# Patient Record
Sex: Female | Born: 1935 | Race: White | Hispanic: No | Marital: Married | Smoking: Never smoker
Health system: Southern US, Community
[De-identification: ages and names within clinical notes are randomized; demographics above are authoritative.]

## PROBLEM LIST (undated history)

## (undated) DIAGNOSIS — R238 Other skin changes: Secondary | ICD-10-CM

## (undated) DIAGNOSIS — C801 Malignant (primary) neoplasm, unspecified: Secondary | ICD-10-CM

## (undated) DIAGNOSIS — R233 Spontaneous ecchymoses: Secondary | ICD-10-CM

## (undated) DIAGNOSIS — I499 Cardiac arrhythmia, unspecified: Secondary | ICD-10-CM

## (undated) DIAGNOSIS — I10 Essential (primary) hypertension: Secondary | ICD-10-CM

## (undated) DIAGNOSIS — I059 Rheumatic mitral valve disease, unspecified: Secondary | ICD-10-CM

## (undated) DIAGNOSIS — S7291XA Unspecified fracture of right femur, initial encounter for closed fracture: Secondary | ICD-10-CM

## (undated) HISTORY — PX: TONSILLECTOMY: SUR1361

## (undated) HISTORY — PX: CHOLECYSTECTOMY: SHX55

## (undated) HISTORY — PX: COLONOSCOPY: SHX174

---

## 2008-05-07 HISTORY — PX: CARDIAC VALVE SURGERY: SHX40

## 2011-04-18 ENCOUNTER — Other Ambulatory Visit: Payer: Self-pay | Admitting: Radiology

## 2011-04-18 DIAGNOSIS — C50911 Malignant neoplasm of unspecified site of right female breast: Secondary | ICD-10-CM

## 2011-04-19 ENCOUNTER — Telehealth: Payer: Self-pay | Admitting: *Deleted

## 2011-04-19 ENCOUNTER — Other Ambulatory Visit: Payer: Self-pay | Admitting: *Deleted

## 2011-04-19 DIAGNOSIS — C50319 Malignant neoplasm of lower-inner quadrant of unspecified female breast: Secondary | ICD-10-CM

## 2011-04-19 NOTE — Telephone Encounter (Signed)
Confirmed [facility] for 04/25/11 at 0815 .  Instructions and contact information given.  

## 2011-04-19 NOTE — Telephone Encounter (Signed)
Opened in error

## 2011-04-23 ENCOUNTER — Other Ambulatory Visit: Payer: Self-pay

## 2011-04-24 ENCOUNTER — Ambulatory Visit
Admission: RE | Admit: 2011-04-24 | Discharge: 2011-04-24 | Disposition: A | Discharge status: Home / Self Care | Payer: Medicare Other | Source: Ambulatory Visit | Attending: Radiology | Admitting: Radiology

## 2011-04-24 DIAGNOSIS — C50911 Malignant neoplasm of unspecified site of right female breast: Secondary | ICD-10-CM

## 2011-04-24 MED ORDER — GADOBENATE DIMEGLUMINE 529 MG/ML IV SOLN
5.0000 mL | Freq: Once | INTRAVENOUS | Status: AC | PRN
  Administered 2011-04-24: 5 mL via INTRAVENOUS

## 2011-04-25 ENCOUNTER — Encounter: Payer: Self-pay | Admitting: *Deleted

## 2011-04-25 ENCOUNTER — Ambulatory Visit (HOSPITAL_BASED_OUTPATIENT_CLINIC_OR_DEPARTMENT_OTHER): Payer: Medicare Other | Admitting: General Surgery

## 2011-04-25 ENCOUNTER — Encounter (INDEPENDENT_AMBULATORY_CARE_PROVIDER_SITE_OTHER): Payer: Self-pay | Admitting: General Surgery

## 2011-04-25 ENCOUNTER — Telehealth: Payer: Self-pay | Admitting: *Deleted

## 2011-04-25 ENCOUNTER — Ambulatory Visit: Payer: Medicare Other

## 2011-04-25 ENCOUNTER — Ambulatory Visit (HOSPITAL_BASED_OUTPATIENT_CLINIC_OR_DEPARTMENT_OTHER): Payer: BC Managed Care – PPO | Admitting: Oncology

## 2011-04-25 ENCOUNTER — Other Ambulatory Visit (INDEPENDENT_AMBULATORY_CARE_PROVIDER_SITE_OTHER): Payer: Self-pay | Admitting: General Surgery

## 2011-04-25 ENCOUNTER — Ambulatory Visit
Admission: RE | Admit: 2011-04-25 | Discharge: 2011-04-25 | Disposition: A | Discharge status: Home / Self Care | Payer: Medicare Other | Source: Ambulatory Visit | Attending: Radiation Oncology | Admitting: Radiation Oncology

## 2011-04-25 ENCOUNTER — Ambulatory Visit: Payer: Medicare Other | Attending: General Surgery | Admitting: Physical Therapy

## 2011-04-25 ENCOUNTER — Other Ambulatory Visit (HOSPITAL_BASED_OUTPATIENT_CLINIC_OR_DEPARTMENT_OTHER): Payer: Medicare Other | Admitting: Lab

## 2011-04-25 VITALS — BP 163/77 | HR 66 | Temp 97.6°F | Ht 65.5 in | Wt 119.1 lb

## 2011-04-25 DIAGNOSIS — C50919 Malignant neoplasm of unspecified site of unspecified female breast: Secondary | ICD-10-CM

## 2011-04-25 DIAGNOSIS — M25619 Stiffness of unspecified shoulder, not elsewhere classified: Secondary | ICD-10-CM | POA: Insufficient documentation

## 2011-04-25 DIAGNOSIS — C50319 Malignant neoplasm of lower-inner quadrant of unspecified female breast: Secondary | ICD-10-CM

## 2011-04-25 DIAGNOSIS — R293 Abnormal posture: Secondary | ICD-10-CM | POA: Insufficient documentation

## 2011-04-25 DIAGNOSIS — IMO0001 Reserved for inherently not codable concepts without codable children: Secondary | ICD-10-CM | POA: Insufficient documentation

## 2011-04-25 DIAGNOSIS — M40299 Other kyphosis, site unspecified: Secondary | ICD-10-CM | POA: Insufficient documentation

## 2011-04-25 LAB — CANCER ANTIGEN 27.29: CA 27.29: 38 U/mL (ref 0–39)

## 2011-04-25 LAB — CBC WITH DIFFERENTIAL/PLATELET
BASO%: 0.2 % (ref 0.0–2.0)
EOS%: 0.5 % (ref 0.0–7.0)
MCH: 32.8 pg (ref 25.1–34.0)
MCV: 98.8 fL (ref 79.5–101.0)
MONO%: 6.3 % (ref 0.0–14.0)
NEUT#: 5.4 10*3/uL (ref 1.5–6.5)
RBC: 4.25 10*6/uL (ref 3.70–5.45)
RDW: 14.5 % (ref 11.2–14.5)

## 2011-04-25 LAB — COMPREHENSIVE METABOLIC PANEL
ALT: 20 U/L (ref 0–35)
AST: 23 U/L (ref 0–37)
Albumin: 3.9 g/dL (ref 3.5–5.2)
Alkaline Phosphatase: 121 U/L — ABNORMAL HIGH (ref 39–117)
Potassium: 3.8 mEq/L (ref 3.5–5.3)
Sodium: 138 mEq/L (ref 135–145)
Total Protein: 7.1 g/dL (ref 6.0–8.3)

## 2011-04-25 NOTE — Progress Notes (Signed)
LAQUITTA Hamilton is an 77 y.o. female.   Chief Complaint: right breast cancer HPI: this patient is seen in the multiple disciplinary breast clinic for evaluation of a screening detected right breast cancer. She has had 2 prior cyst aspirations on that side but she states that these were benign in denies any other changes. She states that she does her self breast exam and denies any changes or new masses. She denies any prior mammographic abnormalities. She denies any systemic symptoms such as headaches, bony pains, or weight loss. She denies any significant family history of breast cancer. On her imaging studies she has a 6 mm nodule on ultrasound at the 5:00 position of the right breast and followup MRI demonstrated a 5 mm area of enhancement in the same area. This was either positive and PR positive and HER-2/neu negative. She has a history of mitral valve issues in atrial fibrillation but otherwise is fairly healthy.  No past medical history on file. Mitral valve surgery Atrial fibrillation  Past Surgical History  Procedure Date  . Tonsillectomy   . Cholecystectomy   . Cardiac valve surgery     No family history on file. Social History:  reports that she has never smoked. She does not have any smokeless tobacco history on file. She reports that she does not drink alcohol. Her drug history not on file.  Allergies:  Allergies  Allergen Reactions  . Sulfa Antibiotics     Childhood reaction unknown   All other review of systems negative or noncontributory except as stated in the HPI  No current outpatient prescriptions on file as of 04/25/2011.   Medications Prior to Admission  Medication Dose Route Frequency Provider Last Rate Last Dose  . gadobenate dimeglumine (MULTIHANCE) injection 5 mL  5 mL Intravenous Once PRN Medication Radiologist   5 mL at 04/24/11 1030    Results for orders placed in visit on 04/25/11 (from the past 48 hour(s))  CBC WITH DIFFERENTIAL     Status: Abnormal   Collection Time   04/25/11  8:31 AM      Component Value Range Comment   WBC 6.7  3.9 - 10.3 (10e3/uL)    NEUT# 5.4  1.5 - 6.5 (10e3/uL)    HGB 13.9  11.6 - 15.9 (g/dL)    HCT 16.1  09.6 - 04.5 (%)    Platelets 170  145 - 400 (10e3/uL)    MCV 98.8  79.5 - 101.0 (fL)    MCH 32.8  25.1 - 34.0 (pg)    MCHC 33.2  31.5 - 36.0 (g/dL)    RBC 4.09  8.11 - 9.14 (10e6/uL)    RDW 14.5  11.2 - 14.5 (%)    lymph# 0.9  0.9 - 3.3 (10e3/uL)    MONO# 0.4  0.1 - 0.9 (10e3/uL)    Eosinophils Absolute 0.0  0.0 - 0.5 (10e3/uL)    Basophils Absolute 0.0  0.0 - 0.1 (10e3/uL)    NEUT% 80.1 (*) 38.4 - 76.8 (%)    LYMPH% 12.9 (*) 14.0 - 49.7 (%)    MONO% 6.3  0.0 - 14.0 (%)    EOS% 0.5  0.0 - 7.0 (%)    BASO% 0.2  0.0 - 2.0 (%)   COMPREHENSIVE METABOLIC PANEL     Status: Abnormal   Collection Time   04/25/11  8:31 AM      Component Value Range Comment   Sodium 138  135 - 145 (mEq/L)    Potassium 3.8  3.5 -  5.3 (mEq/L)    Chloride 103  96 - 112 (mEq/L)    CO2 27  19 - 32 (mEq/L)    Glucose, Bld 81  70 - 99 (mg/dL)    BUN 22  6 - 23 (mg/dL)    Creatinine, Ser 4.09  0.50 - 1.10 (mg/dL)    Total Bilirubin 0.6  0.3 - 1.2 (mg/dL)    Alkaline Phosphatase 121 (*) 39 - 117 (U/L)    AST 23  0 - 37 (U/L)    ALT 20  0 - 35 (U/L)    Total Protein 7.1  6.0 - 8.3 (g/dL)    Albumin 3.9  3.5 - 5.2 (g/dL)    Calcium 9.3  8.4 - 10.5 (mg/dL)    Mr Breast Bilateral W Wo Contrast  04/24/2011  *RADIOLOGY REPORT*  Clinical Data: 77 year old female patient with recently diagnosed right breast  invasive mammary carcinoma.  Family history of breast cancer in an aunt at age 56.  The patient is extremely kyphotic and had difficulty remain motionless for the study. Decreased contrast dose was utilized (5 ml of Multihance).  BILATERAL BREAST MRI WITH AND WITHOUT CONTRAST  Technique: Multiplanar, multisequence MR images of both breasts were obtained prior to and following the intravenous administration of 5ml of Multihance.  Three  dimensional images were evaluated at the independent DynaCad workstation.  Comparison:  04/17/2011, 03/27/2011, and 03/22/2011 mammograms.  Findings: Considerable motion is present on the study due to the patient's kyphosis and age.  This limits the sensitivity of the examination.  The known invasive mammary carcinoma is visualized within the inferior portion of the right breast at the 6 o'clock position and measures 5 mm in size.  There is a clip artifact associated with the mass.  There are no additional worrisome enhancing foci within either breast.  There is no evidence for axillary or internal mammary adenopathy.  Generalized cardiomegaly is present.  IMPRESSION:  1.  5 mm enhancing mass with adjacent clip artifact within the inferior portion of the right breast at approximately the 6 o'clock position corresponding to the known recently diagnosed invasive mammary carcinoma. 2.  Limited study due to motion artifacts as discussed above.  THREE-DIMENSIONAL MR IMAGE RENDERING ON INDEPENDENT WORKSTATION:  Three-dimensional MR images were rendered by post-processing of the original MR data on an independent workstation.  The three- dimensional MR images were interpreted, and findings were reported in the accompanying complete MRI report for this study.  BI-RADS CATEGORY 6:  Known biopsy-proven malignancy - appropriate action should be taken.  Original Report Authenticated By: Rolla Plate, M.D.    @ROS @  Blood pressure 163/77, pulse 66, temperature 97.6 F (36.4 C), height 5' 5.5" (1.664 m), weight 119 lb 1.6 oz (54.023 kg). General appearance: alert, cooperative and no distress Head: Normocephalic, without obvious abnormality, atraumatic Eyes: conjunctivae/corneas clear. PERRL, EOM's intact. Fundi benign. Neck: no adenopathy, no carotid bruit and supple, symmetrical, trachea midline Resp: clear to auscultation bilaterally Chest wall: no tenderness Breasts: She hasn't bruising and postbiopsy changes  in the inferior portion of her right breast. She has a questionable nodule in the area  of the bruising and prior biopsy though I do not identify a definitive mass. Her axilla is negative for palpable disease she does have right lateral chest wall incision from her prior surgery. Her left breast is without skin changes, suspicious masses, or lymphadenopathy Cardio: regular rate and rhythm, S1, S2 normal, no murmur, click, rub or gallop GI: soft, non-tender; bowel sounds  normal; no masses,  no organomegaly Extremities: extremities normal, atraumatic, no cyanosis or edema Pulses: 2+ and symmetric Skin: Skin color, texture, turgor normal. No rashes or lesions Neurologic: Grossly normal  Assessment/Plan Right-sided breast cancer We have discussed this patient in our multidisciplinary breast conference and she would be a candidate for lumpectomy or mastectomy. She has a small tumor and certainly would be reasonable for breast conservation therapy. I have recommended needle localized lumpectomy on her right breast. I also explained that traditionally we would recommend sentinel lymph node biopsy and radiation as well although she is very minute with our oncologist and given her age, she is not likely going to receive any chemotherapy. I explained that since she is not going to receive any chemotherapy, there is no need to perform sentinel lymph node biopsy since this will not make any clinical difference for her. However, we will not know her true stage. She expressed understanding of the recommendations as well as the traditional recommendations for breast cancer therapy and she is in agreement with the plan for lumpectomy without sentinel lymph node biopsy and without chemotherapy. She will likely receive formal treatment and plus or minus radiation therapy. We will set her up for needle localized right breast lumpectomy as soon as available. Discussed with her the risks of recurrence, infection, bleeding,  need for future surgery and reoperation to get negative margins, and poor cosmesis and she expressed understanding and desires to proceed.   Lodema Pilot DAVID 04/25/2011, 12:29 PM

## 2011-04-25 NOTE — Progress Notes (Signed)
[facility] Radiation Oncology NEW PATIENT EVALUATION  Name: Alexis Hamilton MRN: [id number]  Date: 04/25/2011  DOB: [birth year]  Status: outpatient   CC:No primary provider on file.  Rulon Abide, DO    REFERRING PHYSICIAN: Rulon Abide, DO   DIAGNOSIS: Stage I (T1, N0, M0) invasive mammary carcinoma of the right breast    HISTORY OF PRESENT ILLNESS::Alexis Hamilton is a 77 y.o. female who is seen today for for evaluation of her T1 N0 invasive mammary carcinoma of the right breast. At the time of a screening mammogram in [place] on 03/22/2011 she is found to have a focal asymmetry within the right breast. Additional views and ultrasound on 04/02/2011 showed a spiculated mass with some microcalcifications located in 5 to 6:00 within the right breast, 3.5 cm from the nipple. This measures 0.6 cm on ultrasound at 5:00. Ultrasound-guided biopsy on 04/17/2011 was diagnostic for invasive mammary carcinoma which was strongly ER/PR positive with a low proliferation marker of 11%. She is without complaints today.  Marland Kitchen   PREVIOUS RADIATION THERAPY: No   PAST MEDICAL HISTORY:  has no past medical history on file.     PAST SURGICAL HISTORY: Past Surgical History  Procedure Date  . Tonsillectomy   . Cholecystectomy   . Cardiac valve surgery      FAMILY HISTORY: family history is not on file.   SOCIAL HISTORY:  reports that she has never smoked. She does not have any smokeless tobacco history on file. She reports that she does not drink alcohol.   ALLERGIES: Sulfa antibiotics   MEDICATIONS:  Current Outpatient Prescriptions  Medication Sig Dispense Refill  . amiodarone (PACERONE) 200 MG tablet Take 200 mg by mouth daily.        . calcium carbonate (OS-CAL) 600 MG TABS Take 600 mg by mouth 2 (two) times daily with a meal. With D       . furosemide (LASIX) 40 MG tablet Take 40 mg by mouth daily.        . Multiple Vitamin (MULTIVITAMIN) tablet Take 1 tablet  by mouth daily.        Marland Kitchen warfarin (COUMADIN) 1 MG tablet Take 1 mg by mouth as directed. 1mg  1-3 days 2mg  2-4 days           REVIEW OF SYSTEMS:  Pertinent items are noted in HPI.    PHYSICAL EXAM:  Alert and oriented 77 year-old white female appearing her stated age. Vital signs BP 163/77, pulse 66, temperature 97.6, RR 20 Head and neck examination grossly unremarkable. Nodes: Without palpable cervical, supraclavicular, or axillary lymphadenopathy. Chest: Lungs clear. Back: Without spinal or CVA discomfort. Heart: Regular rate and rhythm. Breast: There is a bruise at approximately 7:00 along the right breast. No masses are appreciated. Left breast without masses or lesions. Abdomen without hepatomegaly. Extremities without edema.   LABORATORY DATA:  Lab Results  Component Value Date   WBC 6.7 04/25/2011   HGB 13.9 04/25/2011   HCT 42.0 04/25/2011   MCV 98.8 04/25/2011   PLT 170 04/25/2011   Lab Results  Component Value Date   NA 138 04/25/2011   K 3.8 04/25/2011   CL 103 04/25/2011   CO2 27 04/25/2011   Lab Results  Component Value Date   ALT 20 04/25/2011   AST 23 04/25/2011   ALKPHOS 121* 04/25/2011   BILITOT 0.6 04/25/2011      IMPRESSION: Stage I (T1, N0, M0) invasive mammary carcinoma of the right  breast. I explained to the patient and her family that her local treatment options include mastectomy versus partial mastectomy plus or minus hormone therapy, plus or minus radiation therapy. While most patients would accept 5 years of adjuvant hormone therapy, and alternative would be 3 and one half weeks of right breast radiation should she not want to take hormone therapy for 5 years. I discussed the potential acute and late toxicities of radiation therapy. Recent therapy could be delivered in [place], and again she would receive a hypo-fractionated course. Dr. Darnelle Catalan will discuss with her adjuvant hormone therapy   PLAN: As above. The patient informs me that she may want to  avoid taking a pill for 5 years and, therefore she may be a candidate for radiation therapy is that of adjuvant hormone therapy. This can be discussed with Dr. Darnelle Catalan as he reviews the potential toxicities of adjuvant hormone therapy. I spent 40 minutes minutes face to face with the patient and more than 50% of that time was spent in counseling and/or coordination of care.

## 2011-04-25 NOTE — Telephone Encounter (Signed)
spoke with star made patient appointment for 06-12-2011 arrival time 12:00pm relied the message to dawn the nurse for the breast clinic

## 2011-04-25 NOTE — Progress Notes (Signed)
Alexis Hamilton  MR#: [id number]    History of present illness: The patient is an 77 year old [place], [place] woman who had routine screening mammography in [place] on 03/22/2011. The breasts were described as heterogeneously dense. In the right breast there was a focal asymmetry newly noted, and the patient was recalled for additional views 04/13/2011. An area of asymmetry with spiculated margins and associated microcalcifications was identified, measuring 6 mm by ultrasound, hypoechoic, with ill-defined margins. Ultrasound-guided core biopsy was performed 04/17/2011 and showed ([id number]) and invasive carcinoma with lobular features, grade 2, estrogen and progesterone 100% positive, with an MIB-1-of 11%, and no HER-2 amplification. MRI was obtained, showing a 5 mm solitary lesion (there was some motion artifact).  With this information the patient was presented at the multidisciplinary breast cancer conference 04/25/2011, and seen in the multidisciplinary breast cancer clinic the same day.  Past medical history:     No past medical history on file. History of osteopenia, cardiac arrhythmia Past surgical history:      Past Surgical History  Procedure Date  . Tonsillectomy   . Cholecystectomy   . Cardiac valve surgery    the patient underwent an annuloplasty procedure at [facility] in 2009.  Family history:    The patient's father died at the age of 12 from a presumed amount myocardial infarction. The patient's mother died at the age of 37 with Parkinson's disease. The patient was an only child. The patient's father had 5 sisters one of whom was diagnosed with breast cancer at an advanced age.  Gynecologic history:  GX P2, first pregnancy to term age 29. She had menarche age 26 and menopause in 73. She never used hormone replacement    Social history:   She is a retired Catering manager. Her husband Everlean Alstrom of work at [employer] as a Charity fundraiser. Daughter Lemont Fillers [address], teaches [employer] in  [place], [place]; daughter Ozzie Hoyle 47 teaches second grade in [place]. The patient has 1 grandchild. She attends a [employer]    ADVANCED DIRECTIVES: In place  Health maintenance:       History  Substance Use Topics  . Smoking status: Never Smoker   . Smokeless tobacco: Not on file  . Alcohol Use: No      Colonoscopy: 2002  PAP: -  Bone density: Osteopenia; most recent bone density more than 2 years ago  Cholesterol:   Review of systems:  She bruises easily. She sleeps with an extra pillow. Otherwise a detailed review of systems today was entirely unremarkable.  Allergies:     Allergies  Allergen Reactions  . Sulfa Antibiotics     Childhood reaction unknown    Medications:      Current Outpatient Prescriptions  Medication Sig Dispense Refill  . amiodarone (PACERONE) 200 MG tablet Take 200 mg by mouth daily.        . calcium carbonate (OS-CAL) 600 MG TABS Take 600 mg by mouth 2 (two) times daily with a meal. With D       . furosemide (LASIX) 40 MG tablet Take 40 mg by mouth daily.        . Multiple Vitamin (MULTIVITAMIN) tablet Take 1 tablet by mouth daily.        Marland Kitchen warfarin (COUMADIN) 1 MG tablet Take 1 mg by mouth as directed. 1mg  1-3 days 2mg  2-4 days         Physical exam:      Filed Vitals:   04/25/11 0853  BP: 163/77  Pulse:  66  Temp: 97.6 F (36.4 C)     Body mass index is 19.52 kg/(m^2).   ECOG performance status: 0  Oropharynx: Clear  Lungs: No rales or rhonchi  Adenopathy: No palpable adenopathy in including in particular the right axilla, which was benign to palpation  Heart regular rate and rhythm, no murmur appreciated  Abdomen soft nontender positive bowel sounds  Right breast: no masses palpated; no skin changes or nipple retraction noted other than minor ecchymosis from the recent biopsy.  Left breast: No suspicious findings  Musculoskeletal exam: Mild kyphosis moderate scoliosis, no focal spinal tenderness, no peripheral  edema  Neurologic exam: Nonfocal  Lab results:            Chemistry      Component Value Date/Time   NA 138 04/25/2011 0831   K 3.8 04/25/2011 0831   CL 103 04/25/2011 0831   CO2 27 04/25/2011 0831   BUN 22 04/25/2011 0831   CREATININE 0.94 04/25/2011 0831      Component Value Date/Time   CALCIUM 9.3 04/25/2011 0831   ALKPHOS 121* 04/25/2011 0831   AST 23 04/25/2011 0831   ALT 20 04/25/2011 0831   BILITOT 0.6 04/25/2011 0831         Lab Results  Component Value Date   WBC 6.7 04/25/2011   HGB 13.9 04/25/2011   HCT 42.0 04/25/2011   MCV 98.8 04/25/2011   PLT 170 04/25/2011   NEUTROABS 5.4 04/25/2011    Studies:      Mr Breast Bilateral W Wo Contrast  04/24/2011  *RADIOLOGY REPORT*  Clinical Data: 77 year old female patient with recently diagnosed right breast  invasive mammary carcinoma.  Family history of breast cancer in an aunt at age 54.  The patient is extremely kyphotic and had difficulty remain motionless for the study. Decreased contrast dose was utilized (5 ml of Multihance).  BILATERAL BREAST MRI WITH AND WITHOUT CONTRAST  Technique: Multiplanar, multisequence MR images of both breasts were obtained prior to and following the intravenous administration of 5ml of Multihance.  Three dimensional images were evaluated at the independent DynaCad workstation.  Comparison:  04/17/2011, 03/27/2011, and 03/22/2011 mammograms.  Findings: Considerable motion is present on the study due to the patient's kyphosis and age.  This limits the sensitivity of the examination.  The known invasive mammary carcinoma is visualized within the inferior portion of the right breast at the 6 o'clock position and measures 5 mm in size.  There is a clip artifact associated with the mass.  There are no additional worrisome enhancing foci within either breast.  There is no evidence for axillary or internal mammary adenopathy.  Generalized cardiomegaly is present.  IMPRESSION:  1.  5 mm enhancing  mass with adjacent clip artifact within the inferior portion of the right breast at approximately the 6 o'clock position corresponding to the known recently diagnosed invasive mammary carcinoma. 2.  Limited study due to motion artifacts as discussed above.  THREE-DIMENSIONAL MR IMAGE RENDERING ON INDEPENDENT WORKSTATION:  Three-dimensional MR images were rendered by post-processing of the original MR data on an independent workstation.  The three- dimensional MR images were interpreted, and findings were reported in the accompanying complete MRI report for this study.  BI-RADS CATEGORY 6:  Known biopsy-proven malignancy - appropriate action should be taken.  Original Report Authenticated By: Rolla Plate, M.D.     Assessment: 77 year old [place] woman status post right breast biopsy December of 2012 for an invasive breast cancer with lobular  features, clinically T1b N0, Stage I, strongly estrogen and progesterone receptor positive, with an MIB-1 of 11, and no HER-2 amplification.     Plan: We spent the better part of her hour-long visit today discussing the biology of her tumor, and the implications for treatment. She understands she has a very good prognosis she also understands that women over 66 have a choice after lumpectomy of receiving radiation alone, radiation plus anti-estrogens, or anti-estrogens alone.  She would not be a candidate for tamoxifen given her need for lifelong anticoagulation because of her arrhythmia and prior heart surgery. I think she would be a good candidate for aromatase inhibitors, except that she really has some kyphosis, and she tells me her cardiologist Dr. Earlene Plater has told her she should stay away from Reclast. I have written a letter to Dr. Earlene Plater to get more information regarding this, but if indeed it is not feasible to proceed to bisphosphonates, I would avoid aromatase inhibitors as well. In that case  she would simply have radiation and followup with  observation.  Because the patient lives in [place] she would prefer to be followed closer to home. We are setting her up to see our partner Dr. Ubaldo Glassing in [place], [place]. She will also have her radiation treatments there. Accordingly no further appointments have been made for her here.   Yaeko Fazekas C 04/25/2011

## 2011-04-26 ENCOUNTER — Encounter: Payer: Self-pay | Admitting: *Deleted

## 2011-04-26 NOTE — Progress Notes (Signed)
Clinical Social Work met with pt and pt's husband at [facility].  CSW informed pt of the patient and family support center, programs, and resources.  CSW also provided pt with contact information and a patient and family support program calendar.  Pt did not express any urgent needs, and was thankful for the support.  CSW encouraged pt to contact CSW with any needs and/or concerns.

## 2011-04-30 ENCOUNTER — Telehealth: Payer: Self-pay | Admitting: *Deleted

## 2011-04-30 NOTE — Telephone Encounter (Signed)
called left message to inform  Alexis Hamilton that the patient needs appointment with dr.murray

## 2011-05-02 ENCOUNTER — Encounter (HOSPITAL_COMMUNITY): Payer: Self-pay

## 2011-05-03 ENCOUNTER — Telehealth: Payer: Self-pay | Admitting: *Deleted

## 2011-05-03 ENCOUNTER — Encounter: Payer: Self-pay | Admitting: *Deleted

## 2011-05-03 NOTE — Telephone Encounter (Signed)
Spoke to pt concerning [facility] from 04/25/11.  Pt denies questions or concerns at this time.  Encourage pt to call with needs.  Contact information given.

## 2011-05-04 ENCOUNTER — Telehealth: Payer: Self-pay | Admitting: *Deleted

## 2011-05-04 NOTE — Telephone Encounter (Signed)
left message to inform the patient of  the new appointment in [facility] with Dr. Michell Heinrich on 05-07-2011 at 8:15am

## 2011-05-07 ENCOUNTER — Encounter: Payer: Self-pay | Admitting: *Deleted

## 2011-05-07 NOTE — Progress Notes (Signed)
Mailed after appt letter to pt. 

## 2011-05-14 NOTE — Pre-Procedure Instructions (Signed)
20 Alexis Hamilton  05/14/2011   Your procedure is scheduled on:  Thurs, Jan 10 @ 1:30 PM  Report to [facility] at 11:30  AM.  Call this number if you have problems the morning of surgery: [phone]   Remember:   Do not eat food:After Midnight.  May have clear liquids: up to 4 Hours before arrival.(until 7:30 am)  Clear liquids include soda, tea, black coffee, apple or grape juice, broth.  Take these medicines the morning of surgery with A SIP OF WATER: Amiodarone   Do not wear jewelry, make-up or nail polish.  Do not wear lotions, powders, or perfumes. You may wear deodorant.  Do not shave 48 hours prior to surgery.  Do not bring valuables to the hospital.  Contacts, dentures or bridgework may not be worn into surgery.  Leave suitcase in the car. After surgery it may be brought to your room.  For patients admitted to the hospital, checkout time is 11:00 AM the day of discharge.   Patients discharged the day of surgery will not be allowed to drive home.  Name and phone number of your driver:   Special Instructions: CHG Shower Use Special Wash: 1/2 bottle night before surgery and 1/2 bottle morning of surgery.   Please read over the following fact sheets that you were given: Pain Booklet, Coughing and Deep Breathing, MRSA Information and Surgical Site Infection Prevention

## 2011-05-15 ENCOUNTER — Encounter (HOSPITAL_COMMUNITY)
Admission: RE | Admit: 2011-05-15 | Discharge: 2011-05-15 | Disposition: A | Discharge status: Home / Self Care | Payer: Medicare Other | Source: Ambulatory Visit | Attending: General Surgery | Admitting: General Surgery

## 2011-05-15 ENCOUNTER — Encounter (HOSPITAL_COMMUNITY): Payer: Self-pay

## 2011-05-15 HISTORY — DX: Rheumatic mitral valve disease, unspecified: I05.9

## 2011-05-15 HISTORY — DX: Spontaneous ecchymoses: R23.3

## 2011-05-15 HISTORY — DX: Malignant (primary) neoplasm, unspecified: C80.1

## 2011-05-15 HISTORY — DX: Other skin changes: R23.8

## 2011-05-15 HISTORY — DX: Cardiac arrhythmia, unspecified: I49.9

## 2011-05-15 LAB — BASIC METABOLIC PANEL
BUN: 27 mg/dL — ABNORMAL HIGH (ref 6–23)
Calcium: 9.3 mg/dL (ref 8.4–10.5)
Chloride: 108 mEq/L (ref 96–112)
Creatinine, Ser: 1.07 mg/dL (ref 0.50–1.10)
GFR calc Af Amer: 55 mL/min — ABNORMAL LOW (ref >90)
GFR calc non Af Amer: 48 mL/min — ABNORMAL LOW (ref >90)

## 2011-05-15 LAB — CBC
HCT: 42.7 % (ref 36.0–46.0)
MCHC: 32.1 g/dL (ref 30.0–36.0)
Platelets: 204 10*3/uL (ref 150–400)
RDW: 14.6 % (ref 11.5–15.5)

## 2011-05-15 LAB — PROTIME-INR: INR: 1.24 (ref 0.00–1.49)

## 2011-05-15 NOTE — Progress Notes (Signed)
Dr.Steven Earlene Plater in [place] is cardiologist;last visit 2 months ago  Never had a stress test Heart cath unsure of when it was Echo done but unsure of when EkG/CXR  All of the above info to be requested

## 2011-05-15 NOTE — Consult Note (Signed)
Anesthesia:  Patient is an 78 year old female scheduled for right breast partial mastectomy on 05/17/11.  Her history included hypothyroidism, MVP s/p MV Repair, and breast CA.  Her Cardiologist is Dr. Lamont Snowball in [place], [place].  She was seen in July of 2012.  Dr. Earlene Plater also gave Coumadin recommendations for this procedure.  She has chronic afib at a rate of 57 bpm, LAD, LAFB, and LVH with repolarization abnormality by her 11/30/10 EKG done at his office.  An echo done on 11/30/10 showed normal LV function, EF 55%, markedly enlarged LA, mitral prosthetic ring with appropriate function and no MR.  Her last cath in 2007 showed essentially normal epicardial coronary arteries with low normal LV systolic function with anterior apical hypokinesis, and MVP.  (To my understanding, she underwent MV Repair in 2009.)  Labs noted.  Her BUN was elevated at 27, but Cr was normal at 1.07.  Her PTT was WNL.  Her PT was mildly elevated, but her INR was normal at 1.24.   She is for a CXR on the day of surgery.  If no worrisome findings, then plan to proceed.

## 2011-05-16 MED ORDER — CEFAZOLIN SODIUM 1-5 GM-% IV SOLN
1.0000 g | INTRAVENOUS | Status: AC
  Administered 2011-05-17: 1 g via INTRAVENOUS
  Filled 2011-05-16: qty 50

## 2011-05-17 ENCOUNTER — Ambulatory Visit (HOSPITAL_COMMUNITY): Payer: Medicare Other | Admitting: Vascular Surgery

## 2011-05-17 ENCOUNTER — Encounter (HOSPITAL_COMMUNITY): Payer: Self-pay | Admitting: *Deleted

## 2011-05-17 ENCOUNTER — Encounter (HOSPITAL_COMMUNITY)
Admission: RE | Disposition: A | Discharge status: Home / Self Care | Payer: Self-pay | Source: Ambulatory Visit | Attending: General Surgery

## 2011-05-17 ENCOUNTER — Encounter (HOSPITAL_COMMUNITY): Payer: Self-pay | Admitting: Vascular Surgery

## 2011-05-17 ENCOUNTER — Ambulatory Visit (HOSPITAL_COMMUNITY): Payer: Medicare Other

## 2011-05-17 ENCOUNTER — Other Ambulatory Visit (INDEPENDENT_AMBULATORY_CARE_PROVIDER_SITE_OTHER): Payer: Self-pay | Admitting: General Surgery

## 2011-05-17 ENCOUNTER — Ambulatory Visit (HOSPITAL_COMMUNITY)
Admission: RE | Admit: 2011-05-17 | Discharge: 2011-05-17 | Disposition: A | Discharge status: Home / Self Care | Payer: Medicare Other | Source: Ambulatory Visit | Attending: General Surgery | Admitting: General Surgery

## 2011-05-17 DIAGNOSIS — C50919 Malignant neoplasm of unspecified site of unspecified female breast: Secondary | ICD-10-CM | POA: Insufficient documentation

## 2011-05-17 DIAGNOSIS — Z01812 Encounter for preprocedural laboratory examination: Secondary | ICD-10-CM | POA: Insufficient documentation

## 2011-05-17 DIAGNOSIS — C50319 Malignant neoplasm of lower-inner quadrant of unspecified female breast: Secondary | ICD-10-CM

## 2011-05-17 HISTORY — PX: BREAST BIOPSY: SHX20

## 2011-05-17 SURGERY — BREAST BIOPSY WITH NEEDLE LOCALIZATION
Anesthesia: General | Site: Breast | Laterality: Right | Wound class: Clean

## 2011-05-17 MED ORDER — LIDOCAINE-EPINEPHRINE (PF) 1 %-1:200000 IJ SOLN
INTRAMUSCULAR | Status: DC | PRN
  Administered 2011-05-17: 16:00:00

## 2011-05-17 MED ORDER — PROMETHAZINE HCL 25 MG/ML IJ SOLN: 6.2500 mg | INTRAMUSCULAR | Status: DC | PRN

## 2011-05-17 MED ORDER — GLYCOPYRROLATE 0.2 MG/ML IJ SOLN
INTRAMUSCULAR | Status: DC | PRN
  Administered 2011-05-17 (×2): 0.2 mg via INTRAVENOUS

## 2011-05-17 MED ORDER — HYDROMORPHONE HCL PF 1 MG/ML IJ SOLN: 0.2500 mg | INTRAMUSCULAR | Status: DC | PRN

## 2011-05-17 MED ORDER — 0.9 % SODIUM CHLORIDE (POUR BTL) OPTIME
TOPICAL | Status: DC | PRN
  Administered 2011-05-17: 1000 mL

## 2011-05-17 MED ORDER — PROPOFOL 10 MG/ML IV EMUL
INTRAVENOUS | Status: DC | PRN
  Administered 2011-05-17: 180 mg via INTRAVENOUS

## 2011-05-17 MED ORDER — LACTATED RINGERS IV SOLN
INTRAVENOUS | Status: DC
  Administered 2011-05-17: 14:00:00 via INTRAVENOUS

## 2011-05-17 MED ORDER — LACTATED RINGERS IV SOLN: INTRAVENOUS | Status: DC

## 2011-05-17 MED ORDER — MIDAZOLAM HCL 5 MG/5ML IJ SOLN
INTRAMUSCULAR | Status: DC | PRN
  Administered 2011-05-17: 1 mg via INTRAVENOUS

## 2011-05-17 MED ORDER — ONDANSETRON HCL 4 MG/2ML IJ SOLN
INTRAMUSCULAR | Status: DC | PRN
  Administered 2011-05-17: 4 mg via INTRAVENOUS

## 2011-05-17 MED ORDER — MORPHINE SULFATE 2 MG/ML IJ SOLN: 0.0500 mg/kg | INTRAMUSCULAR | Status: DC | PRN

## 2011-05-17 MED ORDER — MEPERIDINE HCL 25 MG/ML IJ SOLN: 6.2500 mg | INTRAMUSCULAR | Status: DC | PRN

## 2011-05-17 MED ORDER — LIDOCAINE HCL (CARDIAC) 20 MG/ML IV SOLN
INTRAVENOUS | Status: DC | PRN
  Administered 2011-05-17: 50 mg via INTRAVENOUS

## 2011-05-17 MED ORDER — HYDROCODONE-ACETAMINOPHEN 5-500 MG PO TABS: 1.0000 | ORAL_TABLET | Freq: Four times a day (QID) | ORAL | Status: AC | PRN

## 2011-05-17 MED ORDER — FENTANYL CITRATE 0.05 MG/ML IJ SOLN
INTRAMUSCULAR | Status: DC | PRN
  Administered 2011-05-17 (×2): 25 ug via INTRAVENOUS
  Administered 2011-05-17: 50 ug via INTRAVENOUS
  Administered 2011-05-17: 25 ug via INTRAVENOUS

## 2011-05-17 MED ORDER — LACTATED RINGERS IV SOLN
INTRAVENOUS | Status: DC | PRN
  Administered 2011-05-17: 15:00:00 via INTRAVENOUS

## 2011-05-17 SURGICAL SUPPLY — 46 items
APPLIER CLIP 9.375 MED OPEN (MISCELLANEOUS)
BINDER BREAST LRG (GAUZE/BANDAGES/DRESSINGS) IMPLANT
BINDER BREAST MEDIUM (GAUZE/BANDAGES/DRESSINGS) IMPLANT
BINDER BREAST XLRG (GAUZE/BANDAGES/DRESSINGS) IMPLANT
CANISTER SUCTION 2500CC (MISCELLANEOUS) ×2 IMPLANT
CHLORAPREP W/TINT 26ML (MISCELLANEOUS) ×2 IMPLANT
CLIP APPLIE 9.375 MED OPEN (MISCELLANEOUS) IMPLANT
CLIP TI WIDE RED SMALL 6 (CLIP) ×2 IMPLANT
CLOTH BEACON ORANGE TIMEOUT ST (SAFETY) ×2 IMPLANT
CONT SPEC STER OR (MISCELLANEOUS) ×2 IMPLANT
COVER SURGICAL LIGHT HANDLE (MISCELLANEOUS) ×2 IMPLANT
DECANTER SPIKE VIAL GLASS SM (MISCELLANEOUS) IMPLANT
DERMABOND ADVANCED (GAUZE/BANDAGES/DRESSINGS) ×1
DERMABOND ADVANCED .7 DNX12 (GAUZE/BANDAGES/DRESSINGS) ×1 IMPLANT
DEVICE DUBIN SPECIMEN MAMMOGRA (MISCELLANEOUS) ×2 IMPLANT
DRAPE CHEST BREAST 15X10 FENES (DRAPES) ×2 IMPLANT
ELECT CAUTERY BLADE 6.4 (BLADE) ×2 IMPLANT
ELECT COATED BLADE 2.86 ST (ELECTRODE) ×2 IMPLANT
ELECT REM PT RETURN 9FT ADLT (ELECTROSURGICAL) ×2
ELECTRODE REM PT RTRN 9FT ADLT (ELECTROSURGICAL) ×1 IMPLANT
GLOVE BIO SURGEON STRL SZ7.5 (GLOVE) ×4 IMPLANT
GLOVE BIOGEL PI IND STRL 6.5 (GLOVE) ×2 IMPLANT
GLOVE BIOGEL PI IND STRL 7.5 (GLOVE) ×2 IMPLANT
GLOVE BIOGEL PI INDICATOR 6.5 (GLOVE) ×2
GLOVE BIOGEL PI INDICATOR 7.5 (GLOVE) ×2
GLOVE SS BIOGEL STRL SZ 6.5 (GLOVE) ×1 IMPLANT
GLOVE SUPERSENSE BIOGEL SZ 6.5 (GLOVE) ×1
GLOVE SURG SS PI 7.5 STRL IVOR (GLOVE) ×4 IMPLANT
GOWN PREVENTION PLUS XLARGE (GOWN DISPOSABLE) ×4 IMPLANT
GOWN STRL NON-REIN LRG LVL3 (GOWN DISPOSABLE) ×4 IMPLANT
KIT BASIN OR (CUSTOM PROCEDURE TRAY) ×2 IMPLANT
KIT MARKER MARGIN INK (KITS) IMPLANT
KIT ROOM TURNOVER OR (KITS) ×2 IMPLANT
NEEDLE HYPO 25GX1X1/2 BEV (NEEDLE) ×2 IMPLANT
NS IRRIG 1000ML POUR BTL (IV SOLUTION) ×2 IMPLANT
PACK GENERAL/GYN (CUSTOM PROCEDURE TRAY) ×2 IMPLANT
PAD ARMBOARD 7.5X6 YLW CONV (MISCELLANEOUS) ×4 IMPLANT
STAPLER VISISTAT 35W (STAPLE) IMPLANT
SUT MNCRL AB 4-0 PS2 18 (SUTURE) ×2 IMPLANT
SUT SILK 2 0 SH (SUTURE) ×2 IMPLANT
SUT VIC AB 3-0 SH 18 (SUTURE) ×2 IMPLANT
SUT VIC AB 3-0 SH 8-18 (SUTURE) ×2 IMPLANT
SYR CONTROL 10ML LL (SYRINGE) ×2 IMPLANT
TOWEL OR 17X24 6PK STRL BLUE (TOWEL DISPOSABLE) IMPLANT
TOWEL OR 17X26 10 PK STRL BLUE (TOWEL DISPOSABLE) ×2 IMPLANT
TOWEL OR NON WOVEN STRL DISP B (DISPOSABLE) ×2 IMPLANT

## 2011-05-17 NOTE — Op Note (Signed)
NAME:  Alexis Hamilton, Alexis Hamilton NO.:  [id number]  MEDICAL RECORD NO.:  [id number]  LOCATION:  [facility]                         FACILITY:  [facility]  PHYSICIAN:  Lodema Pilot, MD       DATE OF BIRTH:  05-Feb-1929  DATE OF PROCEDURE:  05/17/2011 DATE OF DISCHARGE:  05/17/2011                              OPERATIVE REPORT   PROCEDURE:  Needle localized right partial mastectomy.  PREOPERATIVE DIAGNOSIS:  Right breast cancer.  POSTOPERATIVE DIAGNOSIS:  Right breast cancer.  SURGEON:  Lodema Pilot, MD  ASSISTANT:  None.  ANESTHESIA:  General LMA anesthesia with 35 mL of 1% lidocaine with epinephrine and 0.25% of Marcaine in a 50:50 mixture.  FLUIDS:  600 mL of crystalloid.  ESTIMATED BLOOD LOSS:  Minimal.  DRAINS:  None.  SPECIMENS: 1. Right breast lumpectomy with a short stitch marking the superior     margin and long stitch marking the lateral margin. 2. Additional lateral margin with a silk stitch marking the new     lateral margin.  COMPLICATIONS:  None apparent.  FINDINGS:  Successful needle localized lumpectomy of the right breast. Confirmation of the retrieval of the clip was obtained by Dr. Yolanda Bonine and short stitch marks superior margin, long stitch marked the lateral margin.  Additional lateral margin was taken with the new silk stitch marked in new lateral margin.  Hemoclips were placed at the wound edges.  INDICATION FOR PROCEDURE:  Ms. Gunderman is an 78 year old female with a newly diagnosed right breast cancer needs surgical removal.  She had met with a radiation oncologist and the medical oncologist, and we did not perform sentinel lymph node biopsy because she was not going to be offered chemotherapy regardless of nodal status.  OPERATIVE DETAILS:  Ms. Schoenfelder was seen and evaluated in preoperative area and risks and benefits of procedure were again discussed in lay terms.  Informed consent was obtained.  I again discussed with her the fact that this  was not standard to commit sentinel lymph node biopsy. However, given the fact that she was not going to be offered chemotherapy regardless of her nodal status, sentinel lymph node biopsy would not be useful other than for staging purposes, and she expressed understanding.  She had already had a needle localization of her right breast mass and surgical site was marked.  Then, prophylactic antibiotics were given, and she was taken to the operating room, placed on table in supine position and general LMA anesthesia was obtained. Her right chest and breasts were prepped and draped in a standard surgical fashion, and procedure time-out was performed with all operative team members to confirm proper patient, procedure, then a circumareolar incision was made in the skin over the anticipated lesion and dissection carried down into the breast tissue using Bovie electrocautery.  1-cm thick breast flaps were created circumferentially and then, dissected down to the pectoral fascia.  The lesion was dissected circumferentially around the wire, and a short silk stitch was placed on the superior margin. A long silk stitch was placed on the lateral margin.  The lesion was undermined along the pectoralis fascia and then removed, and x-ray was performed, which demonstrated a clip  in the middle of the specimen, and Dr. Yolanda Bonine called and confirmed that the clip was indeed located within the specimen and in good position.  I then just judging by the placement of the needle within the specimen I felt that if there was going to be a close margin that this would be the lateral margin, so I took additional centimeter thick tissue of margin on the lateral aspect of the wound and down to the pectoralis fascia, and new silk stitch was placed on the lateral margin, and specimen was marked.  The silk stitch marks the new lateral margin on this additional lateral margin of tissue.  The wound was then irrigated with  sterile saline solution and hemostasis obtained with Bovie electrocautery.  If the inferior margin is positive, then there is not much tissue available to take posterior margin is the pectoralis fascia.  Medial margin is also limited on the tissue available for resection.  If margins are positive, we could probably take some more from the superior margin and the lateral margin if necessary.  The wound was hemostatic and hemoclips were placed at the margins of the wound at the base of the wound, and then the dermis was approximated with interrupted 3-0 Vicryl sutures, and prior to securing the final 2 sutures, angiocatheter was placed in the wound, and the wound was filled with 35 mL of 1% lidocaine with epinephrine and 0.25% Marcaine in a 50:50 mixture, and the final sutures were secured for a watertight closure.  Skin edges were approximated with 4-0 Monocryl subcuticular suture and Dermabond was applied.  All sponge, needle, and instrument counts were correct at the end of the case, and the patient tolerated the procedure well without apparent complication.  She was stable and ready for transfer to recovery room in stable condition.          ______________________________ Lodema Pilot, MD     BL/MEDQ  D:  05/17/2011  T:  05/17/2011  Job:  [id number]

## 2011-05-17 NOTE — Brief Op Note (Signed)
05/17/2011  4:05 PM  PATIENT:  Harlow Asa  78 y.o. female  PRE-OPERATIVE DIAGNOSIS:  Right breast cancer   POST-OPERATIVE DIAGNOSIS:  Right breast cancer   PROCEDURE:  Procedure(s): BREAST BIOPSY WITH NEEDLE LOCALIZATION  SURGEON:  Surgeon(s): Rulon Abide, DO  PHYSICIAN ASSISTANT:   ASSISTANTS: none   ANESTHESIA:   general  EBL:  Total I/O In: 600 [I.V.:600] Out: -   BLOOD ADMINISTERED:none  DRAINS: none   LOCAL MEDICATIONS USED:  MARCAINE 17CC and LIDOCAINE 17CC  SPECIMEN:  Source of Specimen:  right breast lumpectomy, additional lateral margin  DISPOSITION OF SPECIMEN:  PATHOLOGY  COUNTS:  YES  TOURNIQUET:  * No tourniquets in log *  DICTATION: .Other Dictation: Dictation Number [id number]  PLAN OF CARE: Discharge to home after PACU  PATIENT DISPOSITION:  PACU - hemodynamically stable.   Delay start of Pharmacological VTE agent (>24hrs) due to surgical blood loss or risk of bleeding:  {YES/NO/NOT APPLICABLE:20182

## 2011-05-17 NOTE — H&P (View-Only) (Signed)
Alexis Hamilton  MR#: [id number]    History of present illness: The patient is an 78-year-old [place], [place] woman who had routine screening mammography in [place] on 03/22/2011. The breasts were described as heterogeneously dense. In the right breast there was a focal asymmetry newly noted, and the patient was recalled for additional views 04/13/2011. An area of asymmetry with spiculated margins and associated microcalcifications was identified, measuring 6 mm by ultrasound, hypoechoic, with ill-defined margins. Ultrasound-guided core biopsy was performed 04/17/2011 and showed ([id number]) and invasive carcinoma with lobular features, grade 2, estrogen and progesterone 100% positive, with an MIB-1-of 11%, and no HER-2 amplification. MRI was obtained, showing a 5 mm solitary lesion (there was some motion artifact).  With this information the patient was presented at the multidisciplinary breast cancer conference 04/25/2011, and seen in the multidisciplinary breast cancer clinic the same day.  Past medical history:     No past medical history on file. History of osteopenia, cardiac arrhythmia Past surgical history:      Past Surgical History  Procedure Date  . Tonsillectomy   . Cholecystectomy   . Cardiac valve surgery    the patient underwent an annuloplasty procedure at [facility] in 2009.  Family history:    The patient's father died at the age of 68 from a presumed amount myocardial infarction. The patient's mother died at the age of 70 with Parkinson's disease. The patient was an only child. The patient's father had 5 sisters one of whom was diagnosed with breast cancer at an advanced age.  Gynecologic history:  GX P2, first pregnancy to term age 32. She had menarche age 14 and menopause in 1985. She never used hormone replacement    Social history:   She is a retired bookkeeper. Her husband Maurice of work at [employer] as a chemist. Daughter Elizabeth Pitts 49, teaches [employer] in  [place], [place]; daughter Cynthia Daniel 47 teaches second grade in [place]. The patient has 1 grandchild. She attends a [employer]    ADVANCED DIRECTIVES: In place  Health maintenance:       History  Substance Use Topics  . Smoking status: Never Smoker   . Smokeless tobacco: Not on file  . Alcohol Use: No      Colonoscopy: 2002  PAP: -  Bone density: Osteopenia; most recent bone density more than 2 years ago  Cholesterol:   Review of systems:  She bruises easily. She sleeps with an extra pillow. Otherwise a detailed review of systems today was entirely unremarkable.  Allergies:     Allergies  Allergen Reactions  . Sulfa Antibiotics     Childhood reaction unknown    Medications:      Current Outpatient Prescriptions  Medication Sig Dispense Refill  . amiodarone (PACERONE) 200 MG tablet Take 200 mg by mouth daily.        . calcium carbonate (OS-CAL) 600 MG TABS Take 600 mg by mouth 2 (two) times daily with a meal. With D       . furosemide (LASIX) 40 MG tablet Take 40 mg by mouth daily.        . Multiple Vitamin (MULTIVITAMIN) tablet Take 1 tablet by mouth daily.        . warfarin (COUMADIN) 1 MG tablet Take 1 mg by mouth as directed. 1mg 1-3 days 2mg 2-4 days         Physical exam:      Filed Vitals:   04/25/11 0853  BP: 163/77  Pulse:   66  Temp: 97.6 F (36.4 C)     Body mass index is 19.52 kg/(m^2).   ECOG performance status: 0  Oropharynx: Clear  Lungs: No rales or rhonchi  Adenopathy: No palpable adenopathy in including in particular the right axilla, which was benign to palpation  Heart regular rate and rhythm, no murmur appreciated  Abdomen soft nontender positive bowel sounds  Right breast: no masses palpated; no skin changes or nipple retraction noted other than minor ecchymosis from the recent biopsy.  Left breast: No suspicious findings  Musculoskeletal exam: Mild kyphosis moderate scoliosis, no focal spinal tenderness, no peripheral  edema  Neurologic exam: Nonfocal  Lab results:            Chemistry      Component Value Date/Time   NA 138 04/25/2011 0831   K 3.8 04/25/2011 0831   CL 103 04/25/2011 0831   CO2 27 04/25/2011 0831   BUN 22 04/25/2011 0831   CREATININE 0.94 04/25/2011 0831      Component Value Date/Time   CALCIUM 9.3 04/25/2011 0831   ALKPHOS 121* 04/25/2011 0831   AST 23 04/25/2011 0831   ALT 20 04/25/2011 0831   BILITOT 0.6 04/25/2011 0831         Lab Results  Component Value Date   WBC 6.7 04/25/2011   HGB 13.9 04/25/2011   HCT 42.0 04/25/2011   MCV 98.8 04/25/2011   PLT 170 04/25/2011   NEUTROABS 5.4 04/25/2011    Studies:      Mr Breast Bilateral W Wo Contrast  04/24/2011  *RADIOLOGY REPORT*  Clinical Data: 78-year-old female patient with recently diagnosed right breast  invasive mammary carcinoma.  Family history of breast cancer in an aunt at age 41.  The patient is extremely kyphotic and had difficulty remain motionless for the study. Decreased contrast dose was utilized (5 ml of Multihance).  BILATERAL BREAST MRI WITH AND WITHOUT CONTRAST  Technique: Multiplanar, multisequence MR images of both breasts were obtained prior to and following the intravenous administration of 5ml of Multihance.  Three dimensional images were evaluated at the independent DynaCad workstation.  Comparison:  04/17/2011, 03/27/2011, and 03/22/2011 mammograms.  Findings: Considerable motion is present on the study due to the patient's kyphosis and age.  This limits the sensitivity of the examination.  The known invasive mammary carcinoma is visualized within the inferior portion of the right breast at the 6 o'clock position and measures 5 mm in size.  There is a clip artifact associated with the mass.  There are no additional worrisome enhancing foci within either breast.  There is no evidence for axillary or internal mammary adenopathy.  Generalized cardiomegaly is present.  IMPRESSION:  1.  5 mm enhancing  mass with adjacent clip artifact within the inferior portion of the right breast at approximately the 6 o'clock position corresponding to the known recently diagnosed invasive mammary carcinoma. 2.  Limited study due to motion artifacts as discussed above.  THREE-DIMENSIONAL MR IMAGE RENDERING ON INDEPENDENT WORKSTATION:  Three-dimensional MR images were rendered by post-processing of the original MR data on an independent workstation.  The three- dimensional MR images were interpreted, and findings were reported in the accompanying complete MRI report for this study.  BI-RADS CATEGORY 6:  Known biopsy-proven malignancy - appropriate action should be taken.  Original Report Authenticated By: Pioneer JACKSON, M.D.     Assessment: 78-year-old [place] woman status post right breast biopsy December of 2012 for an invasive breast cancer with lobular   features, clinically T1b N0, Stage I, strongly estrogen and progesterone receptor positive, with an MIB-1 of 11, and no HER-2 amplification.     Plan: We spent the better part of her hour-long visit today discussing the biology of her tumor, and the implications for treatment. She understands she has a very good prognosis she also understands that women over 70 have a choice after lumpectomy of receiving radiation alone, radiation plus anti-estrogens, or anti-estrogens alone.  She would not be a candidate for tamoxifen given her need for lifelong anticoagulation because of her arrhythmia and prior heart surgery. I think she would be a good candidate for aromatase inhibitors, except that she really has some kyphosis, and she tells me her cardiologist Dr. Davis has told her she should stay away from Reclast. I have written a letter to Dr. Davis to get more information regarding this, but if indeed it is not feasible to proceed to bisphosphonates, I would avoid aromatase inhibitors as well. In that case  she would simply have radiation and followup with  observation.  Because the patient lives in [place] she would prefer to be followed closer to home. We are setting her up to see our partner Dr. Darovsky in [place], [place]. She will also have her radiation treatments there. Accordingly no further appointments have been made for her here.   MAGRINAT,GUSTAV C 04/25/2011      

## 2011-05-17 NOTE — Anesthesia Postprocedure Evaluation (Signed)
  Anesthesia Post-op Note  Patient: Alexis Hamilton  Procedure(s) Performed:  BREAST BIOPSY WITH NEEDLE LOCALIZATION - Right breast needle localized lumpectomy.  Patient Location: PACU  Anesthesia Type: General  Level of Consciousness: awake  Airway and Oxygen Therapy: Patient Spontanous Breathing  Post-op Pain: mild  Post-op Assessment: Post-op Vital signs reviewed  Post-op Vital Signs: stable  Complications: No apparent anesthesia complications

## 2011-05-17 NOTE — Transfer of Care (Addendum)
Immediate Anesthesia Transfer of Care Note  Patient: Alexis Hamilton  Procedure(s) Performed:  BREAST BIOPSY WITH NEEDLE LOCALIZATION - Right breast needle localized lumpectomy.  Patient Location: PACU  Anesthesia Type: General  Level of Consciousness: awake, alert  and oriented  Airway & Oxygen Therapy: Patient Spontanous Breathing and Patient connected to nasal cannula oxygen  Post-op Assessment: Report given to PACU RN and Post -op Vital signs reviewed and stable  Post vital signs: Reviewed and stable   Complications: No apparent anesthesia complications

## 2011-05-17 NOTE — Anesthesia Preprocedure Evaluation (Addendum)
Anesthesia Evaluation  Patient identified by MRN, date of birth, ID band Patient awake    Airway Mallampati: II      Dental   Pulmonary neg pulmonary ROS,          Cardiovascular + CAD + dysrhythmias Atrial Fibrillation Regular Normal History of Repair of Mitral valve with ring at [facility].   Neuro/Psych Negative Neurological ROS  Negative Psych ROS   GI/Hepatic negative GI ROS, Neg liver ROS,   Endo/Other  Hypothyroidism   Renal/GU      Musculoskeletal   Abdominal   Peds  Hematology negative hematology ROS (+)   Anesthesia Other Findings   Reproductive/Obstetrics                          Anesthesia Physical Anesthesia Plan  ASA: III  Anesthesia Plan: General   Post-op Pain Management:    Induction: Intravenous  Airway Management Planned: LMA  Additional Equipment:   Intra-op Plan:   Post-operative Plan:   Informed Consent:   Plan Discussed with: CRNA  Anesthesia Plan Comments:         Anesthesia Quick Evaluation

## 2011-05-17 NOTE — Preoperative (Signed)
Beta Blockers   Reason not to administer Beta Blockers:Not Applicable 

## 2011-05-17 NOTE — Progress Notes (Signed)
Pt arived a ssa after  Going to the  Breast center .... She is not scheduled in nuclear medicine.Marland KitchenMarland Kitchen

## 2011-05-17 NOTE — Interval H&P Note (Signed)
History and Physical Interval Note:  05/17/2011 2:14 PM  RUPA LAGAN  has presented today for surgery, with the diagnosis of Right breast cancer .  The various methods of treatment have been discussed with the patient and family. After consideration of risks, benefits and other options for treatment, the patient has consented to  Procedure(s): BREAST BIOPSY WITH NEEDLE LOCALIZATION as a surgical intervention .  The patients' history has been reviewed, patient examined, no change in status, stable for surgery.  I have reviewed the patients' chart and labs.  Questions were answered to the patient's satisfaction.  Films reviewed and we again reviewed the risks of the procedure including infection, bleeding, pain, scarring, recurrence, need for repeat surgery, and poor cosmesis and she expressed understanding. I again discussed with her that we normally proceed with sentinel lymph node biopsy for staging and to evaluate nodal status but her oncologist is not offering chemotherapy regardless of nodal status so we have decided not to do SLN bx as this will not affect her treatment in any way. She expressed understanding of this as well.   Lodema Pilot DAVID

## 2011-05-17 NOTE — Anesthesia Procedure Notes (Signed)
Procedure Name: LMA Insertion Date/Time: 05/17/2011 2:53 PM Performed by: Iona Hansen Pre-anesthesia Checklist: Patient identified, Timeout performed, Emergency Drugs available, Suction available and Patient being monitored Patient Re-evaluated:Patient Re-evaluated prior to inductionOxygen Delivery Method: Circle System Utilized Preoxygenation: Pre-oxygenation with 100% oxygen Intubation Type: IV induction Ventilation: Mask ventilation without difficulty LMA: LMA inserted LMA Size: 4.0 Number of attempts: 1 Placement Confirmation: positive ETCO2 and breath sounds checked- equal and bilateral Tube secured with: Tape Dental Injury: Teeth and Oropharynx as per pre-operative assessment

## 2011-05-18 ENCOUNTER — Encounter (HOSPITAL_COMMUNITY): Payer: Self-pay | Admitting: General Surgery

## 2011-05-31 ENCOUNTER — Encounter (INDEPENDENT_AMBULATORY_CARE_PROVIDER_SITE_OTHER): Payer: Self-pay | Admitting: General Surgery

## 2011-05-31 ENCOUNTER — Ambulatory Visit (INDEPENDENT_AMBULATORY_CARE_PROVIDER_SITE_OTHER): Payer: Medicare Other | Admitting: General Surgery

## 2011-05-31 VITALS — BP 144/78 | HR 70 | Temp 97.6°F | Resp 18 | Ht 66.5 in | Wt 118.0 lb

## 2011-05-31 DIAGNOSIS — Z5189 Encounter for other specified aftercare: Secondary | ICD-10-CM

## 2011-05-31 DIAGNOSIS — Z4889 Encounter for other specified surgical aftercare: Secondary | ICD-10-CM

## 2011-05-31 NOTE — Progress Notes (Signed)
Subjective:     Patient ID: Alexis Hamilton, female   DOB: Nov 13, 1928, 78 y.o.   MRN: [id number]  HPI This patient follows up in 2 weeks status post right needle localized lumpectomy for breast cancer. Her pathology was consistent with a low-grade subcentimeter tumor which was ER and PR positive. She had a close superficial margin at 1 mm but other margins were negative. She reports doing well and denies any discomfort. She is scheduled to follow up for radiation treatment next week.the  Review of Systems     Objective:   Physical Exam No distress and nontoxic-appearing  Her incision is healing well without sign of infection. Chest good cosmesis. Nontender.    Assessment:     Status post right needle localized lumpectomy for cancer.  She is doing very well and has good cosmesis. Her pathology was favorable overall. She has a T1 tumor which was low-grade and margins were negative although she had a close superficial margin at 0.1 cm. I explained that margins were negative although this superficial margin was close and generally I would recommend reexcision of the superficial margin although in this case the superficial margin will be close the skin all I think she probably has some tissue that we could harvest from the area.  I have discussed her case with Dr. Dayton Scrape and Dr. Michell Heinrich and after discussion, I have recommended that we perform reexcision of her right breast superficial margin.      Plan:     We will set her up for reexcision of right breast margins.

## 2011-06-01 ENCOUNTER — Encounter (INDEPENDENT_AMBULATORY_CARE_PROVIDER_SITE_OTHER): Payer: Medicare Other | Admitting: General Surgery

## 2011-06-06 ENCOUNTER — Encounter (HOSPITAL_BASED_OUTPATIENT_CLINIC_OR_DEPARTMENT_OTHER): Payer: Self-pay | Admitting: *Deleted

## 2011-06-06 NOTE — Progress Notes (Signed)
Pt had lumpectomy 1/13-did well-will need pt-ptt-istat dos-lives in [place]

## 2011-06-08 ENCOUNTER — Other Ambulatory Visit (INDEPENDENT_AMBULATORY_CARE_PROVIDER_SITE_OTHER): Payer: Self-pay | Admitting: General Surgery

## 2011-06-08 ENCOUNTER — Encounter (HOSPITAL_BASED_OUTPATIENT_CLINIC_OR_DEPARTMENT_OTHER)
Admission: RE | Disposition: A | Discharge status: Home / Self Care | Payer: Self-pay | Source: Ambulatory Visit | Attending: General Surgery

## 2011-06-08 ENCOUNTER — Ambulatory Visit (HOSPITAL_BASED_OUTPATIENT_CLINIC_OR_DEPARTMENT_OTHER)
Admission: RE | Admit: 2011-06-08 | Discharge: 2011-06-08 | Disposition: A | Discharge status: Home / Self Care | Payer: Medicare Other | Source: Ambulatory Visit | Attending: General Surgery | Admitting: General Surgery

## 2011-06-08 ENCOUNTER — Encounter (HOSPITAL_BASED_OUTPATIENT_CLINIC_OR_DEPARTMENT_OTHER): Payer: Self-pay | Admitting: *Deleted

## 2011-06-08 ENCOUNTER — Ambulatory Visit (HOSPITAL_BASED_OUTPATIENT_CLINIC_OR_DEPARTMENT_OTHER): Payer: Medicare Other | Admitting: Certified Registered"

## 2011-06-08 ENCOUNTER — Encounter (HOSPITAL_BASED_OUTPATIENT_CLINIC_OR_DEPARTMENT_OTHER): Payer: Self-pay | Admitting: Certified Registered"

## 2011-06-08 DIAGNOSIS — Z4889 Encounter for other specified surgical aftercare: Secondary | ICD-10-CM

## 2011-06-08 DIAGNOSIS — C50919 Malignant neoplasm of unspecified site of unspecified female breast: Secondary | ICD-10-CM | POA: Insufficient documentation

## 2011-06-08 DIAGNOSIS — I10 Essential (primary) hypertension: Secondary | ICD-10-CM | POA: Insufficient documentation

## 2011-06-08 HISTORY — DX: Essential (primary) hypertension: I10

## 2011-06-08 LAB — POCT I-STAT, CHEM 8
Calcium, Ion: 1.06 mmol/L — ABNORMAL LOW (ref 1.12–1.32)
Chloride: 110 mEq/L (ref 96–112)
HCT: 43 % (ref 36.0–46.0)
Potassium: 4.1 mEq/L (ref 3.5–5.1)
Sodium: 143 mEq/L (ref 135–145)

## 2011-06-08 LAB — PROTIME-INR
INR: 1.18 (ref 0.00–1.49)
Prothrombin Time: 15.3 seconds — ABNORMAL HIGH (ref 11.6–15.2)

## 2011-06-08 SURGERY — EXCISION, LESION, BREAST
Anesthesia: General | Site: Breast | Laterality: Right | Wound class: Clean

## 2011-06-08 MED ORDER — LACTATED RINGERS IV SOLN
INTRAVENOUS | Status: DC
  Administered 2011-06-08: 13:00:00 via INTRAVENOUS

## 2011-06-08 MED ORDER — BUPIVACAINE HCL (PF) 0.25 % IJ SOLN
INTRAMUSCULAR | Status: DC | PRN
  Administered 2011-06-08: 20 mL

## 2011-06-08 MED ORDER — ONDANSETRON HCL 4 MG/2ML IJ SOLN
INTRAMUSCULAR | Status: DC | PRN
  Administered 2011-06-08: 4 mg via INTRAVENOUS

## 2011-06-08 MED ORDER — CEFAZOLIN SODIUM 1-5 GM-% IV SOLN
1.0000 g | INTRAVENOUS | Status: AC
  Administered 2011-06-08: 1 g via INTRAVENOUS

## 2011-06-08 MED ORDER — EPHEDRINE SULFATE 50 MG/ML IJ SOLN
INTRAMUSCULAR | Status: DC | PRN
  Administered 2011-06-08: 10 mg via INTRAVENOUS

## 2011-06-08 MED ORDER — STERILE WATER FOR IRRIGATION IR SOLN
Status: DC | PRN
  Administered 2011-06-08 (×2): 1

## 2011-06-08 MED ORDER — LIDOCAINE-EPINEPHRINE (PF) 1 %-1:200000 IJ SOLN
INTRAMUSCULAR | Status: DC | PRN
  Administered 2011-06-08: 20 mL

## 2011-06-08 MED ORDER — FENTANYL CITRATE 0.05 MG/ML IJ SOLN: 25.0000 ug | INTRAMUSCULAR | Status: DC | PRN

## 2011-06-08 MED ORDER — PROMETHAZINE HCL 25 MG/ML IJ SOLN: 6.2500 mg | INTRAMUSCULAR | Status: DC | PRN

## 2011-06-08 MED ORDER — MIDAZOLAM HCL 5 MG/5ML IJ SOLN
INTRAMUSCULAR | Status: DC | PRN
  Administered 2011-06-08: 1 mg via INTRAVENOUS

## 2011-06-08 MED ORDER — GLYCOPYRROLATE 0.2 MG/ML IJ SOLN
INTRAMUSCULAR | Status: DC | PRN
  Administered 2011-06-08: 0.2 mg via INTRAVENOUS

## 2011-06-08 MED ORDER — PROPOFOL 10 MG/ML IV EMUL
INTRAVENOUS | Status: DC | PRN
  Administered 2011-06-08: 120 mg via INTRAVENOUS

## 2011-06-08 MED ORDER — FENTANYL CITRATE 0.05 MG/ML IJ SOLN
INTRAMUSCULAR | Status: DC | PRN
  Administered 2011-06-08: 25 ug via INTRAVENOUS
  Administered 2011-06-08: 50 ug via INTRAVENOUS

## 2011-06-08 SURGICAL SUPPLY — 44 items
BENZOIN TINCTURE PRP APPL 2/3 (GAUZE/BANDAGES/DRESSINGS) ×2 IMPLANT
BINDER BREAST LRG (GAUZE/BANDAGES/DRESSINGS) IMPLANT
BINDER BREAST MEDIUM (GAUZE/BANDAGES/DRESSINGS) IMPLANT
BINDER BREAST XLRG (GAUZE/BANDAGES/DRESSINGS) IMPLANT
BINDER BREAST XXLRG (GAUZE/BANDAGES/DRESSINGS) IMPLANT
BLADE SURG 15 STRL LF DISP TIS (BLADE) ×1 IMPLANT
BLADE SURG 15 STRL SS (BLADE) ×1
CANISTER SUCTION 1200CC (MISCELLANEOUS) ×2 IMPLANT
CHLORAPREP W/TINT 26ML (MISCELLANEOUS) ×2 IMPLANT
CLIP TI WIDE RED SMALL 6 (CLIP) ×2 IMPLANT
COVER MAYO STAND STRL (DRAPES) ×2 IMPLANT
COVER TABLE BACK 60X90 (DRAPES) ×2 IMPLANT
DECANTER SPIKE VIAL GLASS SM (MISCELLANEOUS) IMPLANT
DRAIN PENROSE 1/2X12 LTX STRL (WOUND CARE) IMPLANT
DRAPE PED LAPAROTOMY (DRAPES) ×2 IMPLANT
DRAPE UTILITY XL STRL (DRAPES) ×2 IMPLANT
ELECT COATED BLADE 2.86 ST (ELECTRODE) ×2 IMPLANT
ELECT REM PT RETURN 9FT ADLT (ELECTROSURGICAL) ×2
ELECTRODE REM PT RTRN 9FT ADLT (ELECTROSURGICAL) ×1 IMPLANT
GAUZE SPONGE 4X4 12PLY STRL LF (GAUZE/BANDAGES/DRESSINGS) ×2 IMPLANT
GLOVE BIO SURGEON STRL SZ7 (GLOVE) ×2 IMPLANT
GLOVE BIOGEL PI IND STRL 7.0 (GLOVE) ×1 IMPLANT
GLOVE BIOGEL PI INDICATOR 7.0 (GLOVE) ×1
GLOVE SURG SS PI 7.5 STRL IVOR (GLOVE) ×6 IMPLANT
GOWN PREVENTION PLUS XLARGE (GOWN DISPOSABLE) ×4 IMPLANT
GOWN PREVENTION PLUS XXLARGE (GOWN DISPOSABLE) IMPLANT
NEEDLE HYPO 22GX1.5 SAFETY (NEEDLE) ×2 IMPLANT
NS IRRIG 1000ML POUR BTL (IV SOLUTION) IMPLANT
PACK BASIN DAY SURGERY FS (CUSTOM PROCEDURE TRAY) ×2 IMPLANT
PENCIL BUTTON HOLSTER BLD 10FT (ELECTRODE) ×2 IMPLANT
SLEEVE SCD COMPRESS KNEE MED (MISCELLANEOUS) ×2 IMPLANT
SPONGE GAUZE 4X4 12PLY (GAUZE/BANDAGES/DRESSINGS) ×2 IMPLANT
SPONGE LAP 4X18 X RAY DECT (DISPOSABLE) ×2 IMPLANT
STRIP CLOSURE SKIN 1/2X4 (GAUZE/BANDAGES/DRESSINGS) ×2 IMPLANT
SUT MNCRL AB 4-0 PS2 18 (SUTURE) ×2 IMPLANT
SUT SILK 2 0 SH (SUTURE) IMPLANT
SUT VICRYL 3-0 CR8 SH (SUTURE) ×2 IMPLANT
SYR CONTROL 10ML LL (SYRINGE) ×2 IMPLANT
TAPE CLOTH SURG 4X10 WHT LF (GAUZE/BANDAGES/DRESSINGS) ×2 IMPLANT
TOWEL OR 17X24 6PK STRL BLUE (TOWEL DISPOSABLE) ×2 IMPLANT
TOWEL OR NON WOVEN STRL DISP B (DISPOSABLE) ×2 IMPLANT
TUBE CONNECTING 20X1/4 (TUBING) ×2 IMPLANT
WATER STERILE IRR 1000ML POUR (IV SOLUTION) ×2 IMPLANT
YANKAUER SUCT BULB TIP NO VENT (SUCTIONS) ×2 IMPLANT

## 2011-06-08 NOTE — Brief Op Note (Signed)
06/08/2011  1:44 PM  PATIENT:  Alexis Hamilton  78 y.o. female  PRE-OPERATIVE DIAGNOSIS:  Right breast cancer  POST-OPERATIVE DIAGNOSIS:  right breast cancer  PROCEDURE:  Procedure(s): RE-EXCISION OF BREAST LUMPECTOMY  SURGEON:  Surgeon(s): Rulon Abide, DO  PHYSICIAN ASSISTANT:   ASSISTANTS: none   ANESTHESIA:   general  EBL:     BLOOD ADMINISTERED:none  DRAINS: none   LOCAL MEDICATIONS USED:  MARCAINE 20CC and LIDOCAINE 20CC  SPECIMEN:  Source of Specimen:  additional superficial margin, suture on new superficial margin  DISPOSITION OF SPECIMEN:  PATHOLOGY  COUNTS:  YES  TOURNIQUET:  * No tourniquets in log *  DICTATION: .Other Dictation: Dictation Number [id number]  PLAN OF CARE: Discharge to home after PACU  PATIENT DISPOSITION:  PACU - hemodynamically stable.   Delay start of Pharmacological VTE agent (>24hrs) due to surgical blood loss or risk of bleeding:  {YES/NO/NOT APPLICABLE:20182

## 2011-06-08 NOTE — Transfer of Care (Signed)
Immediate Anesthesia Transfer of Care Note  Patient: Alexis Hamilton  Procedure(s) Performed:  RE-EXCISION OF BREAST LUMPECTOMY - reexcision of right breast margins  Patient Location: PACU  Anesthesia Type: General  Level of Consciousness: awake, oriented, sedated and patient cooperative  Airway & Oxygen Therapy: Patient Spontanous Breathing and Patient connected to face mask oxygen  Post-op Assessment: Report given to PACU RN and Post -op Vital signs reviewed and stable  Post vital signs: Reviewed and stable  Complications: No apparent anesthesia complications

## 2011-06-08 NOTE — Interval H&P Note (Signed)
History and Physical Interval Note:  06/08/2011 12:37 PM  Alexis Hamilton  has presented today for surgery, with the diagnosis of Right breast cancer  The various methods of treatment have been discussed with the patient and family. After consideration of risks, benefits and other options for treatment, the patient has consented to  Procedure(s): RE-EXCISION OF BREAST LUMPECTOMY as a surgical intervention .  The patients' history has been reviewed, patient examined, no change in status, stable for surgery.  I have reviewed the patients' chart and labs.  Questions were answered to the patient's satisfaction.  Site marked and risks of procedure again discussed including positive margins and need for repeat surgery, and loss of nipple and poor cosmesis and she expressed understanding and desires to proceed with reexcision of right breast margins.   Lodema Pilot DAVID

## 2011-06-08 NOTE — H&P (View-Only) (Signed)
Subjective:     Patient ID: Alexis Hamilton, female   DOB: 01/17/1929, 78 y.o.   MRN: [id number]  HPI This patient follows up in 2 weeks status post right needle localized lumpectomy for breast cancer. Her pathology was consistent with a low-grade subcentimeter tumor which was ER and PR positive. She had a close superficial margin at 1 mm but other margins were negative. She reports doing well and denies any discomfort. She is scheduled to follow up for radiation treatment next week.the  Review of Systems     Objective:   Physical Exam No distress and nontoxic-appearing  Her incision is healing well without sign of infection. Chest good cosmesis. Nontender.    Assessment:     Status post right needle localized lumpectomy for cancer.  She is doing very well and has good cosmesis. Her pathology was favorable overall. She has a T1 tumor which was low-grade and margins were negative although she had a close superficial margin at 0.1 cm. I explained that margins were negative although this superficial margin was close and generally I would recommend reexcision of the superficial margin although in this case the superficial margin will be close the skin all I think she probably has some tissue that we could harvest from the area.  I have discussed her case with Dr. Murray and Dr. Wentworth and after discussion, I have recommended that we perform reexcision of her right breast superficial margin.      Plan:     We will set her up for reexcision of right breast margins.      

## 2011-06-08 NOTE — Anesthesia Procedure Notes (Signed)
Procedure Name: LMA Insertion Date/Time: 06/08/2011 12:50 PM Performed by: Renella Cunas D Pre-anesthesia Checklist: Patient identified, Emergency Drugs available, Suction available and Patient being monitored Patient Re-evaluated:Patient Re-evaluated prior to inductionOxygen Delivery Method: Circle System Utilized Preoxygenation: Pre-oxygenation with 100% oxygen Intubation Type: IV induction Ventilation: Mask ventilation without difficulty LMA: LMA inserted LMA Size: 4.0 Number of attempts: 1 Airway Equipment and Method: bite block Placement Confirmation: positive ETCO2 Tube secured with: Tape Dental Injury: Teeth and Oropharynx as per pre-operative assessment

## 2011-06-08 NOTE — Anesthesia Preprocedure Evaluation (Addendum)
Anesthesia Evaluation  Patient identified by MRN, date of birth, ID band Patient awake    Reviewed: Allergy & Precautions, H&P , NPO status , Patient's Chart, lab work & pertinent test results  History of Anesthesia Complications Negative for: history of anesthetic complications  Airway Mallampati: II TM Distance: >3 FB Neck ROM: Full    Dental No notable dental hx. (+) Dental Advisory Given and Caps   Pulmonary neg pulmonary ROS,  clear to auscultation  Pulmonary exam normal       Cardiovascular hypertension, Pt. on medications + dysrhythmias (no coumadin in 6 days, has converted to SR, INR 1.18 today) Atrial Fibrillation Regular Normal    Neuro/Psych Negative Neurological ROS     GI/Hepatic negative GI ROS, Neg liver ROS,   Endo/Other  Negative Endocrine ROS  Renal/GU negative Renal ROS     Musculoskeletal   Abdominal   Peds  Hematology negative hematology ROS (+)   Anesthesia Other Findings   Reproductive/Obstetrics                          Anesthesia Physical Anesthesia Plan  ASA: III  Anesthesia Plan: General   Post-op Pain Management:    Induction: Intravenous  Airway Management Planned: LMA  Additional Equipment:   Intra-op Plan:   Post-operative Plan:   Informed Consent: I have reviewed the patients History and Physical, chart, labs and discussed the procedure including the risks, benefits and alternatives for the proposed anesthesia with the patient or authorized representative who has indicated his/her understanding and acceptance.   Dental advisory given  Plan Discussed with: CRNA and Surgeon  Anesthesia Plan Comments: (Plan routine monitors, GA- LMA OK  )        Anesthesia Quick Evaluation

## 2011-06-08 NOTE — Op Note (Signed)
NAME:  Alexis Hamilton, Alexis Hamilton NO.:  [id number]  MEDICAL RECORD NO.:  [id number]  LOCATION:                                 FACILITY:  PHYSICIAN:  Lodema Pilot, MD       DATE OF BIRTH:  1928/09/10  DATE OF PROCEDURE:  06/08/2011 DATE OF DISCHARGE:                              OPERATIVE REPORT   PROCEDURE:  Reexcision of right breast margins.  PREOPERATIVE DIAGNOSIS:  Right breast cancer.  POSTOPERATIVE DIAGNOSIS:  Right breast cancer.  SURGEON:  Lodema Pilot, MD  ASSISTANT:  None.  ANESTHESIA:  General LMA anesthesia with 40 mL of 1% lidocaine with epinephrine and 0.25% Marcaine in a 50:50 mixture.  FLUIDS:  900 mL of crystalloid.  ESTIMATED BLOOD LOSS:  Minimal.  DRAINS:  None.  SPECIMENS:  Additional superficial margin sent to Pathology for permanent section with the suture marking the new superficial margin.  COMPLICATIONS:  None apparent.  FINDINGS:  Additional tissue could be taken from the superior margin and medial margin, but no additional tissue can be taken from the deep margin, the inferior margin, or lateral margin or superficial margin. Clips were placed.  INDICATION FOR PROCEDURE:  Alexis Hamilton is an 78 year old female with a recently diagnosed right breast cancer who underwent excision of her prior breast cancer.  She had a close superficial margin, and we recommended reexcision for definitive treatment.  OPERATIVE DETAILS:  Alexis Hamilton was seen and evaluated in the preoperative area, and risks and benefits of the procedure were again discussed in lay terms.  Informed consent was obtained.  Surgical site was marked prior to anesthetic administration.  She was given prophylactic antibiotics and taken to the operating room and placed on table in supine position.  General LMA anesthesia was obtained.  Her right breast and chest were prepped and draped in a standard surgical fashion, and her prior lumpectomy incision was opened to the length  of the incision.  She had some serosanguineous fluid within the cavity and a well-defined seroma cavity, and so I elevated the superficial flaps from the incision cephalad and laterally over the prior biopsy cavity up to basically to the dermis, and a small amount of cushion just under the nipple.  However, if the superficial margin is positive then I would not recommend or be able to perform any additional excision on the superficial margin.  Due to the new margin left is basically the undersurface of the nipple areolar complex.  This was carried up to the end of the biopsy cavity and is carried also for additional tissue laterally, although this is the new superficial margin that actually encompasses some tissue laterally as well.  The lesion was undermined, which basically extended to the chest wall.  The pectoralis muscle was seen deep in the cavity and the deep margin.  Remaining is the pectoralis muscle.  There is no additional tissue that can be taken from the inferior flap as well as this is basically dermis, so there is really no lateral tissue that can really be taken, superficial margin or inferior margin or deep margin as we reached the anatomic landmarks. For these, if she needs tissue from  the medial aspect or superior aspect, we could take some additional tissue if needed.  The cavity was inspected for hemostasis, which was obtained with Bovie electrocautery. The cavity was irrigated with sterile water until irrigation returned clear and again the cavity was noted to be hemostatic.  Hemoclips were placed at the 12 o'clock, 3 o'clock, 6 o'clock, and 9 o'clock position as well as on the chest wall, and the specimen was sent to Pathology as additional labeled additional superficial margin with the suture marking the new superficial margin.  The dermis was then approximated with interrupted 3-0 Vicryl sutures for watertight closure.  However, prior to securing the final suture,  40 mL of 1% lidocaine with epinephrine and 0.25% Marcaine in a 50:50 mixture were infiltrated in the cavity, and the final suture was tied for watertight closure.  The skin edges were then approximated with 4-0 Monocryl subcuticular suture.  Skin was washed and dried and benzoin and Steri-Strips were applied.  Sterile dressing was applied.  All sponge, needle, instrument counts were correct at the end of the case.  The patient tolerated the procedure well without apparent complications.          ______________________________ Lodema Pilot, MD     BL/MEDQ  D:  06/08/2011  T:  06/08/2011  Job:  [id number]

## 2011-06-08 NOTE — Anesthesia Postprocedure Evaluation (Signed)
  Anesthesia Post-op Note  Patient: Alexis Hamilton  Procedure(s) Performed:  RE-EXCISION OF BREAST LUMPECTOMY - reexcision of right breast margins  Patient Location: PACU  Anesthesia Type: General  Level of Consciousness: awake, alert  and oriented  Airway and Oxygen Therapy: Patient Spontanous Breathing  Post-op Pain: none  Post-op Assessment: Post-op Vital signs reviewed, Patient's Cardiovascular Status Stable, Respiratory Function Stable, Patent Airway, No signs of Nausea or vomiting, Adequate PO intake and Pain level controlled  Post-op Vital Signs: Reviewed and stable  Complications: No apparent anesthesia complications

## 2011-06-12 ENCOUNTER — Other Ambulatory Visit: Payer: Self-pay | Admitting: Oncology

## 2011-06-15 ENCOUNTER — Ambulatory Visit (INDEPENDENT_AMBULATORY_CARE_PROVIDER_SITE_OTHER): Payer: Medicare Other | Admitting: General Surgery

## 2011-06-15 ENCOUNTER — Encounter (INDEPENDENT_AMBULATORY_CARE_PROVIDER_SITE_OTHER): Payer: Self-pay | Admitting: General Surgery

## 2011-06-15 VITALS — BP 142/80 | HR 68 | Temp 97.3°F | Resp 18 | Ht 66.5 in | Wt 119.2 lb

## 2011-06-15 DIAGNOSIS — Z4889 Encounter for other specified surgical aftercare: Secondary | ICD-10-CM

## 2011-06-15 DIAGNOSIS — Z5189 Encounter for other specified aftercare: Secondary | ICD-10-CM

## 2011-06-15 NOTE — Patient Instructions (Signed)
Obtain follow up mammogram in September and follow up with Dr. Biagio Quint

## 2011-06-15 NOTE — Progress Notes (Signed)
Subjective:     Patient ID: Alexis Hamilton, female   DOB: 03-Nov-1928, 78 y.o.   MRN: [id number]  HPI This patient follows up status post reexcision of right breast surgical margins for invasive ductal carcinoma. She's done very well with her 2 procedures. She denies any pain. And has no complaints. Her pathology from her reexcision demonstrated no evidence of malignancy.  Review of Systems     Objective:   Physical Exam Her incision is healing well without sign of infection. She has a very good overall cosmetic result without any significant dimpling. The area is nontender.    Assessment:     Status post reexcision of right breast margins for invasive breast cancer. She has done very well from her procedures and has a good cosmetic result and her pathology is benign. She has been offered either adjuvant hormonal treatment or followup radiation therapy and she has chosen to undergo radiation therapy and declined hormonal treatment. She is scheduled to start her radiation March 1.    Plan:     She is going to start radiation on March 1 and I recommended that she obtain a followup right breast mammogram 6 months from the end of her radiation therapy and she will see me back at that time with a followup mammogram and clinical exam. Recommended that she continue with breast exam.

## 2012-02-05 ENCOUNTER — Encounter: Payer: Medicare Other | Admitting: Internal Medicine

## 2012-02-05 DIAGNOSIS — C50919 Malignant neoplasm of unspecified site of unspecified female breast: Secondary | ICD-10-CM

## 2012-08-13 DIAGNOSIS — I4891 Unspecified atrial fibrillation: Secondary | ICD-10-CM

## 2012-08-13 DIAGNOSIS — C50919 Malignant neoplasm of unspecified site of unspecified female breast: Secondary | ICD-10-CM

## 2012-08-13 DIAGNOSIS — Z7901 Long term (current) use of anticoagulants: Secondary | ICD-10-CM

## 2013-02-13 DIAGNOSIS — C50919 Malignant neoplasm of unspecified site of unspecified female breast: Secondary | ICD-10-CM

## 2013-02-13 DIAGNOSIS — Z7901 Long term (current) use of anticoagulants: Secondary | ICD-10-CM

## 2013-02-13 DIAGNOSIS — M810 Age-related osteoporosis without current pathological fracture: Secondary | ICD-10-CM

## 2017-04-30 ENCOUNTER — Inpatient Hospital Stay (HOSPITAL_COMMUNITY)
Admission: AD | Admit: 2017-04-30 | Discharge: 2017-05-07 | DRG: 481 | Disposition: A | Discharge status: Skilled Nursing Facility | Payer: Medicare Other | Source: Other Acute Inpatient Hospital | Attending: Internal Medicine | Admitting: Internal Medicine

## 2017-04-30 ENCOUNTER — Other Ambulatory Visit: Payer: Self-pay

## 2017-04-30 ENCOUNTER — Encounter (HOSPITAL_COMMUNITY): Payer: Self-pay

## 2017-04-30 DIAGNOSIS — Z882 Allergy status to sulfonamides status: Secondary | ICD-10-CM | POA: Diagnosis not present

## 2017-04-30 DIAGNOSIS — M160 Bilateral primary osteoarthritis of hip: Secondary | ICD-10-CM | POA: Diagnosis present

## 2017-04-30 DIAGNOSIS — S7221XA Displaced subtrochanteric fracture of right femur, initial encounter for closed fracture: Secondary | ICD-10-CM | POA: Diagnosis present

## 2017-04-30 DIAGNOSIS — S72141A Displaced intertrochanteric fracture of right femur, initial encounter for closed fracture: Principal | ICD-10-CM | POA: Diagnosis present

## 2017-04-30 DIAGNOSIS — I482 Chronic atrial fibrillation, unspecified: Secondary | ICD-10-CM

## 2017-04-30 DIAGNOSIS — E46 Unspecified protein-calorie malnutrition: Secondary | ICD-10-CM | POA: Diagnosis present

## 2017-04-30 DIAGNOSIS — N179 Acute kidney failure, unspecified: Secondary | ICD-10-CM | POA: Diagnosis present

## 2017-04-30 DIAGNOSIS — D539 Nutritional anemia, unspecified: Secondary | ICD-10-CM | POA: Diagnosis present

## 2017-04-30 DIAGNOSIS — G9389 Other specified disorders of brain: Secondary | ICD-10-CM | POA: Diagnosis present

## 2017-04-30 DIAGNOSIS — S7291XA Unspecified fracture of right femur, initial encounter for closed fracture: Secondary | ICD-10-CM

## 2017-04-30 DIAGNOSIS — C50319 Malignant neoplasm of lower-inner quadrant of unspecified female breast: Secondary | ICD-10-CM

## 2017-04-30 DIAGNOSIS — Z79899 Other long term (current) drug therapy: Secondary | ICD-10-CM | POA: Diagnosis not present

## 2017-04-30 DIAGNOSIS — E559 Vitamin D deficiency, unspecified: Secondary | ICD-10-CM | POA: Diagnosis present

## 2017-04-30 DIAGNOSIS — Z681 Body mass index (BMI) 19 or less, adult: Secondary | ICD-10-CM

## 2017-04-30 DIAGNOSIS — Z7901 Long term (current) use of anticoagulants: Secondary | ICD-10-CM

## 2017-04-30 DIAGNOSIS — Z419 Encounter for procedure for purposes other than remedying health state, unspecified: Secondary | ICD-10-CM

## 2017-04-30 DIAGNOSIS — W010XXA Fall on same level from slipping, tripping and stumbling without subsequent striking against object, initial encounter: Secondary | ICD-10-CM | POA: Diagnosis present

## 2017-04-30 DIAGNOSIS — N183 Chronic kidney disease, stage 3 (moderate): Secondary | ICD-10-CM | POA: Diagnosis present

## 2017-04-30 DIAGNOSIS — Z853 Personal history of malignant neoplasm of breast: Secondary | ICD-10-CM | POA: Diagnosis not present

## 2017-04-30 DIAGNOSIS — S72354A Nondisplaced comminuted fracture of shaft of right femur, initial encounter for closed fracture: Secondary | ICD-10-CM

## 2017-04-30 DIAGNOSIS — I129 Hypertensive chronic kidney disease with stage 1 through stage 4 chronic kidney disease, or unspecified chronic kidney disease: Secondary | ICD-10-CM | POA: Diagnosis present

## 2017-04-30 DIAGNOSIS — M8580 Other specified disorders of bone density and structure, unspecified site: Secondary | ICD-10-CM | POA: Diagnosis present

## 2017-04-30 DIAGNOSIS — T1490XA Injury, unspecified, initial encounter: Secondary | ICD-10-CM

## 2017-04-30 DIAGNOSIS — D62 Acute posthemorrhagic anemia: Secondary | ICD-10-CM | POA: Diagnosis present

## 2017-04-30 DIAGNOSIS — Y92009 Unspecified place in unspecified non-institutional (private) residence as the place of occurrence of the external cause: Secondary | ICD-10-CM

## 2017-04-30 DIAGNOSIS — R7989 Other specified abnormal findings of blood chemistry: Secondary | ICD-10-CM | POA: Diagnosis not present

## 2017-04-30 DIAGNOSIS — M25551 Pain in right hip: Secondary | ICD-10-CM | POA: Diagnosis present

## 2017-04-30 DIAGNOSIS — T148XXA Other injury of unspecified body region, initial encounter: Secondary | ICD-10-CM

## 2017-04-30 DIAGNOSIS — Z9889 Other specified postprocedural states: Secondary | ICD-10-CM

## 2017-04-30 DIAGNOSIS — I959 Hypotension, unspecified: Secondary | ICD-10-CM | POA: Diagnosis present

## 2017-04-30 DIAGNOSIS — D649 Anemia, unspecified: Secondary | ICD-10-CM | POA: Diagnosis not present

## 2017-04-30 HISTORY — DX: Unspecified fracture of right femur, initial encounter for closed fracture: S72.91XA

## 2017-04-30 LAB — TYPE AND SCREEN
ABO/RH(D): O POS
ANTIBODY SCREEN: NEGATIVE

## 2017-04-30 LAB — ALBUMIN: Albumin: 2.8 g/dL — ABNORMAL LOW (ref 3.5–5.0)

## 2017-04-30 LAB — APTT: APTT: 64 s — AB (ref 24–36)

## 2017-04-30 LAB — CALCIUM: CALCIUM: 7.4 mg/dL — AB (ref 8.9–10.3)

## 2017-04-30 LAB — PROTIME-INR
INR: 1.66
PROTHROMBIN TIME: 19.5 s — AB (ref 11.4–15.2)

## 2017-04-30 LAB — ABO/RH: ABO/RH(D): O POS

## 2017-04-30 LAB — MRSA PCR SCREENING: MRSA BY PCR: POSITIVE — AB

## 2017-04-30 MED ORDER — POLYETHYLENE GLYCOL 3350 17 G PO PACK: 17.0000 g | PACK | Freq: Every day | ORAL | Status: DC | PRN

## 2017-04-30 MED ORDER — MORPHINE SULFATE (PF) 2 MG/ML IV SOLN
0.5000 mg | INTRAVENOUS | Status: DC | PRN
  Administered 2017-05-01: 0.5 mg via INTRAVENOUS
  Filled 2017-04-30: qty 1

## 2017-04-30 MED ORDER — WHITE PETROLATUM EX OINT
TOPICAL_OINTMENT | CUTANEOUS | Status: AC
  Administered 2017-04-30: 23:00:00
  Filled 2017-04-30: qty 28.35

## 2017-04-30 MED ORDER — MUPIROCIN 2 % EX OINT
1.0000 "application " | TOPICAL_OINTMENT | Freq: Two times a day (BID) | CUTANEOUS | Status: AC
  Administered 2017-05-01 – 2017-05-04 (×8): 1 via NASAL
  Filled 2017-04-30 (×3): qty 22

## 2017-04-30 MED ORDER — HYDROCODONE-ACETAMINOPHEN 5-325 MG PO TABS
1.0000 | ORAL_TABLET | Freq: Four times a day (QID) | ORAL | Status: DC | PRN
  Administered 2017-04-30 – 2017-05-01 (×2): 1 via ORAL
  Filled 2017-04-30 (×2): qty 1

## 2017-04-30 MED ORDER — DOCUSATE SODIUM 100 MG PO CAPS
100.0000 mg | ORAL_CAPSULE | Freq: Two times a day (BID) | ORAL | Status: DC
  Administered 2017-04-30 – 2017-05-03 (×5): 100 mg via ORAL
  Filled 2017-04-30 (×5): qty 1

## 2017-04-30 MED ORDER — AMIODARONE HCL 100 MG PO TABS
200.0000 mg | ORAL_TABLET | Freq: Every day | ORAL | Status: DC
  Administered 2017-05-02 – 2017-05-07 (×6): 200 mg via ORAL
  Filled 2017-04-30 (×6): qty 2

## 2017-04-30 MED ORDER — CHLORHEXIDINE GLUCONATE CLOTH 2 % EX PADS
6.0000 | MEDICATED_PAD | Freq: Every day | CUTANEOUS | Status: AC
  Administered 2017-05-02 – 2017-05-05 (×4): 6 via TOPICAL

## 2017-04-30 MED ORDER — HEPARIN (PORCINE) IN NACL 100-0.45 UNIT/ML-% IJ SOLN
700.0000 [IU]/h | INTRAMUSCULAR | Status: DC
  Administered 2017-04-30: 700 [IU]/h via INTRAVENOUS
  Filled 2017-04-30: qty 250

## 2017-04-30 NOTE — Plan of Care (Signed)
83 y/o F with medical history significant for AF on eliquis who presented after a fall and found to hae comminuted proximal femur fracture. Also found indeterminate age fracture of dens and C1 on cervical spine imaging ( ofnote patient fell a month prior); so, patiently currently in cervical collar. Transfer to [facility] for [facility]o evaluation ( consulted at OSH prior to arrival). [facility]

## 2017-04-30 NOTE — H&P (Addendum)
History and Physical  Alexis Hamilton YQI:[id number] DOB: [birth year] DOA: 04/30/2017  Referring physician: D PCP: [name], MD  Outpatient Specialists: None Patient coming from:Home At her baseline ambulates with walker  Chief Complaint: hip pain   HPI: Alexis Hamilton is a 83 y.o. female with medical history significant for atrial fibrillation currently on Eliquis and amiodarone and history of mitral valve repair (at [facility] in 2010) who presents as a transfer from outside hospital on 04/30/2017 with right hip pain and was found to have right comminuted fracture of the femur.    Patient states she was in her usual state of health until this morning while trying to walk across the room she fell while using her walker.  She landed on the side of her bed on her right hip.  Patient denied any prodromal symptoms prior to the fall.  She denies chest pain, lightheadedness, palpitations.  Of note, patient recently had surgery on her elbow in October after a fall in her kitchen was also mechanical in nature.  Patient completed rehabilitation at a facility and was discharged last week.  She has been doing well at home with physical therapy 3 times a week.  She lives with her husband.  Patient is adherent with amiodarone for her chronic atrial fibrillation.  She did receive a maze procedure 2010 at [facility] as well as mitral valve repair.  She was previously on Coumadin but switched to Eliquis after elbow surgery in October of this year.  She denies any bleeding episodes while on Eliquis.  Of note she did scratch her right elbow after the fall today.   ED Course: Afebrile, O2 saturation 89% on room air placed on 2 L with improvement to 94%.  Otherwise he dynamically stable.  Lab work significant for creatinine 1.31, BUN 28, potassium 3.8, calcium 7.8.  Hemoglobin 10.8.  INR 1.7, troponin 0 0.025 (-).  UA negative Chest x-ray: Chronic lung changes no acute findings transverse fracture through the left  humeral neck with medial displacement of the distal fragment  X-ray right pelvis comminuted fracture of the proximal right femur with extension into the lesser trochanter, slight medial displacement of the lesser trochanter and distal fragment.  Osteoarthrosis of the hips, osteopenia  X-ray right shoulder no evidence of fracture or dislocation Head CT without contrast no acute intracranial process.  Stable encephalomalacia right temporal and parietal lobes  Cervical spine CT without contrast age-indeterminate fracture to the base of the dens.  Lucencies of the left and right posterior arch of C1 (these findings could be related to acute or chronic injury) questionable minimally displaced fracture of the sternal manubrium.  Patient given IV Zofran and IV morphine 4 mg in the ED.  Review of Systems:As mentioned in the history of present illness.Review of systems are otherwise negative Patient seen in the ED.    Past Medical History:  Diagnosis Date  . Bruises easily    d/t being on Coumadin  . Cancer [facility])    right breast cancer  . Dysrhythmia    A Fib-takes Amiodarone daily  . Hypertension   . Mitral valve disorder    Past Surgical History:  Procedure Laterality Date  . BREAST BIOPSY  05/17/2011   Procedure: BREAST BIOPSY WITH NEEDLE LOCALIZATION;  Surgeon: Judieth Keens, DO;  Location: [place];  Service: General;  Laterality: Right;  Right breast needle localized lumpectomy.  [facility] CARDIAC VALVE SURGERY  2010   ring placed around valve  . CHOLECYSTECTOMY  20+yrs  ago  . COLONOSCOPY    . TONSILLECTOMY     as a teenager    Social History:  reports that  has never smoked. she has never used smokeless tobacco. She reports that she does not drink alcohol or use drugs.   Allergies  Allergen Reactions  . Sulfa Antibiotics     Childhood reaction unknown    Family History  Problem Relation Age of Onset  . Anesthesia problems Neg Hx   . Hypotension Neg Hx   . Malignant  hyperthermia Neg Hx   . Pseudochol deficiency Neg Hx       Prior to Admission medications   Medication Sig Start Date End Date Taking? Authorizing Provider  amiodarone (PACERONE) 200 MG tablet Take 200 mg by mouth daily.      [provider]  Calcium Carbonate-Vitamin D (CALCIUM PLUS VITAMIN D PO) Take 1 tablet by mouth daily.      [provider]  furosemide (LASIX) 40 MG tablet Take 40 mg by mouth daily.      [provider]  Multiple Vitamin (MULTIVITAMIN) tablet Take 1 tablet by mouth daily.      [provider]  warfarin (COUMADIN) 1 MG tablet Take 1-2 mg by mouth as directed. 1mg  (1 tablet) 3 days-on Wednesday , Thursday , Friday and 2mg  (2 tablets) 4 days on Saturday , Sunday, monday, & tuesday    [provider]    Physical Exam: BP (!) 95/56 (BP Location: Right Arm)   Pulse 75   Temp 98.7 F (37.1 C) (Oral)   Resp 15   Ht 5\' 7"  (1.702 m)   Wt 49.9 kg (110 lb)   SpO2 95%   BMI 17.23 kg/m   General: Lying in bed in Appear in no distress Eyes: EOMI, anicteric ENT: Oral Mucosa clear moist Neck: normal, supple, no masses, no JVD Cardiovascular: regular rate and rhythm, no appreciable Murmurs, rubs or gallops. Peripheral Pulses present.  No edema Respiratory: Normal respiratory effort, clear breath sounds on auscultation bilaterally on anterior chest  field, no rales wheezes or rhonchi Abdomen: soft, non-distended, non-tender, normal bowel sounds, no guarding or rebound tenderness Skin:  No Rash Musculoskeletal: Right leg immobilized in brace.  Able to move the toes on command.  Good pulses Neurologic:Grossly no focal neuro deficit.Mental status AAOx3, speech normal, cervical collar in place Psychiatric:Appropriate affect, and mood          Labs on Admission:  Basic Metabolic Panel: No results for input(s): NA, K, CL, CO2, GLUCOSE, BUN, CREATININE, CALCIUM, MG, PHOS in the last 168 hours. Liver Function Tests: No results for  input(s): AST, ALT, ALKPHOS, BILITOT, PROT, ALBUMIN in the last 168 hours. No results for input(s): LIPASE, AMYLASE in the last 168 hours. No results for input(s): AMMONIA in the last 168 hours. CBC: No results for input(s): WBC, NEUTROABS, HGB, HCT, MCV, PLT in the last 168 hours. Cardiac Enzymes: No results for input(s): CKTOTAL, CKMB, CKMBINDEX, TROPONINI in the last 168 hours.  BNP (last 3 results) No results for input(s): BNP in the last 8760 hours.  ProBNP (last 3 results) No results for input(s): PROBNP in the last 8760 hours.  CBG: No results for input(s): GLUCAP in the last 168 hours.  Radiological Exams on Admission: No results found.  EKG: Independently reviewed.  Sinus rhythm with first-degree AV block.  Heart rate 73 bpm. Assessment/Plan Present on Admission: . Femur fracture (HCC)  Active Problems:   Femur fracture, right ([place])  Atrial fibrillation, chronic (HCC)   History of repair of mitral valve   Femur fracture (HCC)  Right comminuted femur fracture secondary to mechanical fall -Ortho consulted appreciate recs -N.p.o. at midnight - Follow-up vitamin D, calcium levels, INR, albumin, EKG -Pain control: Hydrocodone acetaminophen 1-2 tabs every 6 as needed moderate pain, IV morphine 0.5 mg every 2 as needed severe pain -Bowel regimen  Chronic atrial fibrillation, stable -Holding Eliquis in the setting of upcoming surgery -CHA DS VASC: 4, heparin per pharmacy protocol - Continue amiodarone  Age-indeterminate fracture of dens.  Demonstrated on CT cervical spine imaging from outside hospital.  Unable to determine acuity.  Patient is following prior to this admission in addition to 2 months ago. -Spoke with neurosurgery who recommends cervical collar for at least 3 months     DVT prophylaxis: on therapeutic anticoagulation.  Code Status: *Full code  Family Communication: Husband was present at bedside and updated appropriately at the time of interview.     Disposition Plan: Will likely need surgery for right femur fracture, orbital consulted.  On heparin for atrial fibrillation anticoagulation, pain control.  Consults called: Neurosurgery, Orthopedic  Admission status: Admitted as inpatient in telemetry unit.      Desiree Hane MD [facility]  Pager [phone]  If 7PM-7AM, please contact night-coverage [email or web address] Password [email or web address]  04/30/2017, 2:45 PM

## 2017-04-30 NOTE — Consult Note (Signed)
Orthopaedic Trauma Service (OTS) Consult   Patient ID: Alexis Hamilton MRN: [id number] DOB/AGE: [birth year] 83 y.o.  Reason for Consult: Right intertrochanteric hip fracture Referring Physician: Dr. Oretha Milch, MD of [facility]  HPI: Alexis Hamilton is an 83 y.o. female who is being seen in consultation at the request of Dr. Lonny Prude for evaluation of right intertrochanteric hip fracture.  This is an 84 year old female who was at home when she fell and tripped and landed on her right side sustaining a right hip fracture.  At baseline she has a history of A. fib and takes Eliquis as well as amiodarone.  She was recently switched from Coumadin to Eliquis due to a recent fall that she had earlier in the summer where she fractured her left proximal humerus as well as her olecranon.  She underwent a ORIF with an orthopedic surgeon up in [place].  She had recovered well from that and was back home with her husband.  She was ambulating with a four-point cane.  Currently she only complains of right hip pain.  She has a small amount of shoulder pain but she is able to move that without difficulty.  Any attempted range of motion of her hip causes her significant discomfort.  The only thing that makes it better is pain medication and resting.  Denies any injuries to her left lower extremity.  Her husband is at bedside and he was updated plan of care as well.  Past Medical History:  Diagnosis Date  . Bruises easily    d/t being on Coumadin  . Cancer [facility])    right breast cancer  . Dysrhythmia    A Fib-takes Amiodarone daily  . Hypertension   . Mitral valve disorder     Past Surgical History:  Procedure Laterality Date  . BREAST BIOPSY  05/17/2011   Procedure: BREAST BIOPSY WITH NEEDLE LOCALIZATION;  Surgeon: Judieth Keens, DO;  Location: [place];  Service: General;  Laterality: Right;  Right breast needle localized lumpectomy.  Marland Kitchen CARDIAC VALVE SURGERY  2010   ring placed around  valve  . CHOLECYSTECTOMY  20+yrs ago  . COLONOSCOPY    . TONSILLECTOMY     as a teenager    Family History  Problem Relation Age of Onset  . Anesthesia problems Neg Hx   . Hypotension Neg Hx   . Malignant hyperthermia Neg Hx   . Pseudochol deficiency Neg Hx     Social History:  reports that  has never smoked. she has never used smokeless tobacco. She reports that she does not drink alcohol or use drugs.  Allergies:  Allergies  Allergen Reactions  . Sulfa Antibiotics     Childhood reaction unknown    Medications: No current facility-administered medications on file prior to encounter.    Current Outpatient Medications on File Prior to Encounter  Medication Sig Dispense Refill  . amiodarone (PACERONE) 200 MG tablet Take 200 mg by mouth daily.      Marland Kitchen apixaban (ELIQUIS) 2.5 MG TABS tablet Take 2.5 mg by mouth 2 (two) times daily.    . Calcium Carbonate-Vitamin D (CALCIUM PLUS VITAMIN D PO) Take 1 tablet by mouth daily.      . furosemide (LASIX) 40 MG tablet Take 40 mg by mouth daily.      . Multiple Vitamin (MULTIVITAMIN) tablet Take 1 tablet by mouth daily as needed (supplemental).       ROS: Constitutional: No fever or chills Vision: No changes in vision  ENT: No difficulty swallowing CV: No chest pain Pulm: No SOB or wheezing GI: No nausea or vomiting GU: No urgency or inability to hold urine Skin: No poor wound healing Neurologic: No numbness or tingling Psychiatric: No depression or anxiety Heme: +bruising due to anti-coagulation Allergic: No reaction to medications or food   Exam: Blood pressure (!) 95/56, pulse 75, temperature 98.7 F (37.1 C), temperature source Oral, resp. rate 15, height 5\' 7"  (1.702 m), weight 49.9 kg (110 lb), SpO2 95 %. General: No acute distress, thin elderly female laying in bed Orientation: Awake alert and oriented x3 Mood and Affect: Cooperative and pleasant Gait: Not able to be assessed due to hip fracture Coordination and  balance: Within normal limits from my examination  Right lower extremity: Reveals a shortened and externally rotated leg.  There is some chronic venous stasis changes of her lower extremity but no open wounds.  There is no deformity obvious but there is significant swelling about the right hip.  There is a pain with any attempted range of motion of the hip.  There is no effusion on the knee.  I was not able to range knee due to pain in the hip.  The ankle is supple and moves without any discomfort or pain.  She has a warm well-perfused foot with 2+ DP pulse.  She has sensation intact light touch to all nerve distributions.  She has no lymphadenopathy and her reflexes are normal.  Right upper extremity: Reveals skin without lesions.  There is a small ecchymosis about her shoulder.  No deformity and painless range of motion of her shoulder elbow and wrist.  She does have a bandage over her forearm with some mild strikethrough.  This was not taken down today.  She is otherwise neurovascularly intact.  No instability noted of the upper extremity.  Left upper extremity and left lower extremity: Skin without lesions. No tenderness to palpation. Full painless ROM, full strength in each muscle groups without evidence of instability.   Medical Decision Making: Imaging: Displaced reverse obliquity intertroch fracture with displacement of lesser trochanter.  Labs:  CBC    Component Value Date/Time   WBC 8.2 05/15/2011 0924   RBC 4.24 05/15/2011 0924   HGB 14.6 06/08/2011 1140   HGB 13.9 04/25/2011 0831   HCT 43.0 06/08/2011 1140   HCT 42.0 04/25/2011 0831   PLT 204 05/15/2011 0924   PLT 170 04/25/2011 0831   MCV 100.7 (H) 05/15/2011 0924   MCV 98.8 04/25/2011 0831   MCH 32.3 05/15/2011 0924   MCHC 32.1 05/15/2011 0924   RDW 14.6 05/15/2011 0924   RDW 14.5 04/25/2011 0831   LYMPHSABS 0.9 04/25/2011 0831   MONOABS 0.4 04/25/2011 0831   EOSABS 0.0 04/25/2011 0831   BASOSABS 0.0 04/25/2011 0831    Medical history and chart was reviewed  Assessment/Plan: 83 year old female with a history of A. fib on Eliquis with a right intertrochanteric hip fracture.  Patient also has a questionable chronic C1 and C2 fracture that neurosurgery is evaluating her for  I discussed plan of care with the patient and her husband.  I feel that nonoperative treatment is not an option.  I feel that she needs to proceed with surgery.  A cephalo-medullary nailing is most likely to be indicated.  I discussed risks and benefits with the patient. Risks discussed included bleeding requiring blood transfusion, bleeding causing a hematoma, infection, malunion, nonunion, damage to surrounding nerves and blood vessels, pain, hardware prominence or irritation, hardware  failure, stiffness, post-traumatic arthritis, DVT/PE, compartment syndrome, and even death.  I will tentatively put her on the surgery schedule for tomorrow.  Pending or availability as well as half-life of her Eliquis will determine whether or not we will be able to do her surgery tomorrow versus Thursday.  I discussed her care with the hospitalist and we will plan to place her on a heparin drip and stop her Eliquis.  Depending on timing of surgery tomorrow will determine when we hold her heparin drip.  Recommendations: -Bedrest until surgery -Buck's traction 10 pounds -N.p.o. after midnight -Consent for a cephalo-medullary nailing of right hip fracture -Plan for cervical spine per neurosurgery -Heparin drip with plans to hold in the morning for surgery tomorrow-  Shona Needles, MD [facility] [phone] (phone)

## 2017-04-30 NOTE — Progress Notes (Signed)
Orthopedic Tech Progress Note Patient Details:  [name] Aug 17, 1928 [id number]  Ortho Devices Ortho Device/Splint Location: Trapeze bar Ortho Device/Splint Interventions: Application   Post Interventions Patient Tolerated: Well Instructions Provided: Care of device   Alexis Hamilton 04/30/2017, 3:43 PM

## 2017-04-30 NOTE — Progress Notes (Signed)
Orthopedic Tech Progress Note Patient Details:  Alexis Hamilton [birth year] [id number]  Musculoskeletal Traction Type of Traction: Bucks Skin Traction   Post Interventions Patient Tolerated: Refused intervention Instructions Provided: Care of device   [place] Pink 04/30/2017, 7:16 PM

## 2017-04-30 NOTE — Progress Notes (Signed)
ANTICOAGULATION CONSULT NOTE - Initial Consult  Pharmacy Consult:  Heparin Indication: atrial fibrillation  Allergies  Allergen Reactions  . Sulfa Antibiotics     Childhood reaction unknown    Patient Measurements: Height: 5\' 7"  (170.2 cm) Weight: 110 lb (49.9 kg) IBW/kg (Calculated) : 61.6 Heparin Dosing Weight: 50 kg  Vital Signs: Temp: 98.7 F (37.1 C) (12/25 1218) Temp Source: Oral (12/25 1218) BP: 95/56 (12/25 1218) Pulse Rate: 75 (12/25 1218)  Labs: Recent Labs    04/30/17 1328  LABPROT 19.5*  INR 1.66    CrCl cannot be calculated (Patient's most recent lab result is older than the maximum 21 days allowed.).   Medical History: Past Medical History:  Diagnosis Date  . Bruises easily    d/t being on Coumadin  . Cancer [facility])    right breast cancer  . Dysrhythmia    A Fib-takes Amiodarone daily  . Hypertension   . Mitral valve disorder       Assessment: 59 YOF with history of Afib on Eliquis PTA, last dose 04/29/17 at 1800.  Patient transferred to [facility] for femur fracture surgery.  Pharmacy consulted to initiate IV heparin bridge.  No bleeding reported.  Labs from OSH:  SCr 1.3, H/H 10.8/34.2, plts 201   Goal of Therapy:  Heparin level 0.3-0.7 units/ml aPTT 66 - 102 seconds Monitor platelets by anticoagulation protocol: Yes    Plan:  Heparin gtt at 700 units/hr, no bolus Check 8 hr aPTT and heparin level Daily heparin level, aPTT, CBC   Jayani Rozman D. Mina Marble, PharmD, BCPS Pager:  [phone] 04/30/2017, 2:28 PM

## 2017-05-01 ENCOUNTER — Inpatient Hospital Stay (HOSPITAL_COMMUNITY): Payer: Medicare Other

## 2017-05-01 ENCOUNTER — Inpatient Hospital Stay: Payer: Self-pay

## 2017-05-01 ENCOUNTER — Inpatient Hospital Stay (HOSPITAL_COMMUNITY): Payer: Medicare Other | Admitting: Certified Registered Nurse Anesthetist

## 2017-05-01 ENCOUNTER — Encounter (HOSPITAL_COMMUNITY)
Admission: AD | Disposition: A | Discharge status: Skilled Nursing Facility | Payer: Self-pay | Source: Other Acute Inpatient Hospital | Attending: Internal Medicine

## 2017-05-01 ENCOUNTER — Encounter (HOSPITAL_COMMUNITY): Payer: Self-pay | Admitting: Orthopaedic Surgery

## 2017-05-01 DIAGNOSIS — S72141A Displaced intertrochanteric fracture of right femur, initial encounter for closed fracture: Principal | ICD-10-CM

## 2017-05-01 DIAGNOSIS — I959 Hypotension, unspecified: Secondary | ICD-10-CM

## 2017-05-01 DIAGNOSIS — S7221XA Displaced subtrochanteric fracture of right femur, initial encounter for closed fracture: Secondary | ICD-10-CM

## 2017-05-01 HISTORY — PX: INTRAMEDULLARY (IM) NAIL INTERTROCHANTERIC: SHX5875

## 2017-05-01 LAB — BASIC METABOLIC PANEL
Anion gap: 7 (ref 5–15)
BUN: 25 mg/dL — AB (ref 6–20)
CALCIUM: 7.7 mg/dL — AB (ref 8.9–10.3)
CO2: 24 mmol/L (ref 22–32)
CREATININE: 1.24 mg/dL — AB (ref 0.44–1.00)
Chloride: 106 mmol/L (ref 101–111)
GFR calc Af Amer: 44 mL/min — ABNORMAL LOW (ref >60)
GFR, EST NON AFRICAN AMERICAN: 38 mL/min — AB (ref >60)
GLUCOSE: 83 mg/dL (ref 65–99)
Potassium: 4.1 mmol/L (ref 3.5–5.1)
SODIUM: 137 mmol/L (ref 135–145)

## 2017-05-01 LAB — VITAMIN D 25 HYDROXY (VIT D DEFICIENCY, FRACTURES): Vit D, 25-Hydroxy: 26.1 ng/mL — ABNORMAL LOW (ref 30.0–100.0)

## 2017-05-01 LAB — CBC
HCT: 29.6 % — ABNORMAL LOW (ref 36.0–46.0)
Hemoglobin: 9.2 g/dL — ABNORMAL LOW (ref 12.0–15.0)
MCH: 31.4 pg (ref 26.0–34.0)
MCHC: 31.1 g/dL (ref 30.0–36.0)
MCV: 101 fL — ABNORMAL HIGH (ref 78.0–100.0)
PLATELETS: 168 10*3/uL (ref 150–400)
RBC: 2.93 MIL/uL — ABNORMAL LOW (ref 3.87–5.11)
RDW: 16.9 % — AB (ref 11.5–15.5)
WBC: 6.4 10*3/uL (ref 4.0–10.5)

## 2017-05-01 LAB — HEPARIN LEVEL (UNFRACTIONATED)
HEPARIN UNFRACTIONATED: 1.8 [IU]/mL — AB (ref 0.30–0.70)
Heparin Unfractionated: >2.2 IU/mL — ABNORMAL HIGH (ref 0.30–0.70)

## 2017-05-01 LAB — APTT
APTT: 93 s — AB (ref 24–36)
aPTT: 153 seconds — ABNORMAL HIGH (ref 24–36)
aPTT: 67 seconds — ABNORMAL HIGH (ref 24–36)

## 2017-05-01 SURGERY — FIXATION, FRACTURE, INTERTROCHANTERIC, WITH INTRAMEDULLARY ROD
Anesthesia: General | Laterality: Right

## 2017-05-01 MED ORDER — LIDOCAINE 2% (20 MG/ML) 5 ML SYRINGE
INTRAMUSCULAR | Status: DC | PRN
  Administered 2017-05-01: 100 mg via INTRAVENOUS

## 2017-05-01 MED ORDER — ROCURONIUM BROMIDE 50 MG/5ML IV SOSY
PREFILLED_SYRINGE | INTRAVENOUS | Status: DC | PRN
  Administered 2017-05-01: 40 mg via INTRAVENOUS

## 2017-05-01 MED ORDER — CEFAZOLIN SODIUM-DEXTROSE 2-4 GM/100ML-% IV SOLN
2.0000 g | INTRAVENOUS | Status: AC
  Administered 2017-05-01: 2 g via INTRAVENOUS
  Filled 2017-05-01: qty 100

## 2017-05-01 MED ORDER — POVIDONE-IODINE 10 % EX SWAB: 2.0000 "application " | Freq: Once | CUTANEOUS | Status: DC

## 2017-05-01 MED ORDER — HYDROCODONE-ACETAMINOPHEN 5-325 MG PO TABS
1.0000 | ORAL_TABLET | Freq: Four times a day (QID) | ORAL | Status: DC | PRN
  Administered 2017-05-02 – 2017-05-03 (×2): 1 via ORAL
  Filled 2017-05-01 (×3): qty 1

## 2017-05-01 MED ORDER — ONDANSETRON HCL 4 MG/2ML IJ SOLN
INTRAMUSCULAR | Status: AC
  Filled 2017-05-01: qty 8

## 2017-05-01 MED ORDER — DEXAMETHASONE SODIUM PHOSPHATE 10 MG/ML IJ SOLN
INTRAMUSCULAR | Status: AC
  Filled 2017-05-01: qty 3

## 2017-05-01 MED ORDER — OXYCODONE HCL 5 MG PO TABS
5.0000 mg | ORAL_TABLET | ORAL | Status: DC | PRN
  Administered 2017-05-04: 5 mg via ORAL
  Filled 2017-05-01: qty 1

## 2017-05-01 MED ORDER — LACTATED RINGERS IV SOLN
INTRAVENOUS | Status: DC
  Administered 2017-05-01: 15:00:00 via INTRAVENOUS

## 2017-05-01 MED ORDER — ONDANSETRON HCL 4 MG PO TABS: 4.0000 mg | ORAL_TABLET | Freq: Four times a day (QID) | ORAL | Status: DC | PRN

## 2017-05-01 MED ORDER — ROCURONIUM BROMIDE 10 MG/ML (PF) SYRINGE
PREFILLED_SYRINGE | INTRAVENOUS | Status: AC
  Filled 2017-05-01: qty 5

## 2017-05-01 MED ORDER — EPHEDRINE SULFATE-NACL 50-0.9 MG/10ML-% IV SOSY
PREFILLED_SYRINGE | INTRAVENOUS | Status: DC | PRN
  Administered 2017-05-01 (×3): 10 mg via INTRAVENOUS
  Administered 2017-05-01 (×2): 15 mg via INTRAVENOUS
  Administered 2017-05-01: 10 mg via INTRAVENOUS
  Administered 2017-05-01: 5 mg via INTRAVENOUS

## 2017-05-01 MED ORDER — APIXABAN 2.5 MG PO TABS
2.5000 mg | ORAL_TABLET | Freq: Two times a day (BID) | ORAL | Status: DC
  Administered 2017-05-02 – 2017-05-07 (×10): 2.5 mg via ORAL
  Filled 2017-05-01 (×10): qty 1

## 2017-05-01 MED ORDER — FENTANYL CITRATE (PF) 100 MCG/2ML IJ SOLN
INTRAMUSCULAR | Status: DC | PRN
  Administered 2017-05-01: 100 ug via INTRAVENOUS
  Administered 2017-05-01 (×2): 25 ug via INTRAVENOUS

## 2017-05-01 MED ORDER — PROPOFOL 10 MG/ML IV BOLUS
INTRAVENOUS | Status: DC | PRN
  Administered 2017-05-01: 80 mg via INTRAVENOUS

## 2017-05-01 MED ORDER — FENTANYL CITRATE (PF) 250 MCG/5ML IJ SOLN
INTRAMUSCULAR | Status: AC
  Filled 2017-05-01: qty 5

## 2017-05-01 MED ORDER — ONDANSETRON HCL 4 MG/2ML IJ SOLN: 4.0000 mg | Freq: Four times a day (QID) | INTRAMUSCULAR | Status: DC | PRN

## 2017-05-01 MED ORDER — ALBUMIN HUMAN 5 % IV SOLN
INTRAVENOUS | Status: DC | PRN
  Administered 2017-05-01: 18:00:00 via INTRAVENOUS

## 2017-05-01 MED ORDER — OXYCODONE HCL 5 MG PO TABS: 5.0000 mg | ORAL_TABLET | ORAL | 0 refills | Status: DC | PRN

## 2017-05-01 MED ORDER — PHENYLEPHRINE 40 MCG/ML (10ML) SYRINGE FOR IV PUSH (FOR BLOOD PRESSURE SUPPORT)
PREFILLED_SYRINGE | INTRAVENOUS | Status: AC
  Filled 2017-05-01: qty 30

## 2017-05-01 MED ORDER — PHENYLEPHRINE HCL 10 MG/ML IJ SOLN
INTRAVENOUS | Status: DC | PRN
  Administered 2017-05-01: 50 ug/min via INTRAVENOUS

## 2017-05-01 MED ORDER — ACETAMINOPHEN 325 MG PO TABS
650.0000 mg | ORAL_TABLET | Freq: Four times a day (QID) | ORAL | Status: DC | PRN
  Administered 2017-05-05 – 2017-05-07 (×3): 650 mg via ORAL
  Filled 2017-05-01 (×4): qty 2

## 2017-05-01 MED ORDER — CEFAZOLIN SODIUM-DEXTROSE 2-4 GM/100ML-% IV SOLN
2.0000 g | Freq: Two times a day (BID) | INTRAVENOUS | Status: AC
  Administered 2017-05-01 – 2017-05-02 (×3): 2 g via INTRAVENOUS
  Filled 2017-05-01 (×3): qty 100

## 2017-05-01 MED ORDER — PHENYLEPHRINE 40 MCG/ML (10ML) SYRINGE FOR IV PUSH (FOR BLOOD PRESSURE SUPPORT)
PREFILLED_SYRINGE | INTRAVENOUS | Status: DC | PRN
  Administered 2017-05-01 (×5): 80 ug via INTRAVENOUS

## 2017-05-01 MED ORDER — ALUM & MAG HYDROXIDE-SIMETH 200-200-20 MG/5ML PO SUSP: 30.0000 mL | ORAL | Status: DC | PRN

## 2017-05-01 MED ORDER — SODIUM CHLORIDE 0.9 % IJ SOLN
INTRAMUSCULAR | Status: AC
  Filled 2017-05-01: qty 20

## 2017-05-01 MED ORDER — DEXAMETHASONE SODIUM PHOSPHATE 10 MG/ML IJ SOLN
INTRAMUSCULAR | Status: AC
Start: 2017-05-01 — End: 2017-05-01
  Filled 2017-05-01: qty 1

## 2017-05-01 MED ORDER — APIXABAN 2.5 MG PO TABS: 2.5000 mg | ORAL_TABLET | Freq: Two times a day (BID) | ORAL | 0 refills | Status: DC

## 2017-05-01 MED ORDER — METOCLOPRAMIDE HCL 5 MG PO TABS: 5.0000 mg | ORAL_TABLET | Freq: Three times a day (TID) | ORAL | Status: DC | PRN

## 2017-05-01 MED ORDER — EPHEDRINE SULFATE 50 MG/ML IJ SOLN
INTRAMUSCULAR | Status: AC
  Filled 2017-05-01: qty 1

## 2017-05-01 MED ORDER — MORPHINE SULFATE (PF) 2 MG/ML IV SOLN
0.5000 mg | INTRAVENOUS | Status: DC | PRN
  Administered 2017-05-02: 0.5 mg via INTRAVENOUS
  Filled 2017-05-01: qty 1

## 2017-05-01 MED ORDER — SUGAMMADEX SODIUM 200 MG/2ML IV SOLN
INTRAVENOUS | Status: DC | PRN
  Administered 2017-05-01: 100 mg via INTRAVENOUS

## 2017-05-01 MED ORDER — METHOCARBAMOL 1000 MG/10ML IJ SOLN
500.0000 mg | Freq: Four times a day (QID) | INTRAVENOUS | Status: DC | PRN
  Filled 2017-05-01: qty 5

## 2017-05-01 MED ORDER — ONDANSETRON HCL 4 MG/2ML IJ SOLN
INTRAMUSCULAR | Status: DC | PRN
  Administered 2017-05-01: 4 mg via INTRAVENOUS

## 2017-05-01 MED ORDER — HEPARIN (PORCINE) IN NACL 100-0.45 UNIT/ML-% IJ SOLN
500.0000 [IU]/h | INTRAMUSCULAR | Status: DC
  Filled 2017-05-01: qty 250

## 2017-05-01 MED ORDER — METHOCARBAMOL 500 MG PO TABS: 500.0000 mg | ORAL_TABLET | Freq: Four times a day (QID) | ORAL | Status: DC | PRN

## 2017-05-01 MED ORDER — ACETAMINOPHEN 650 MG RE SUPP: 650.0000 mg | Freq: Four times a day (QID) | RECTAL | Status: DC | PRN

## 2017-05-01 MED ORDER — LIDOCAINE 2% (20 MG/ML) 5 ML SYRINGE
INTRAMUSCULAR | Status: AC
Start: 2017-05-01 — End: 2017-05-01
  Filled 2017-05-01: qty 5

## 2017-05-01 MED ORDER — MENTHOL 3 MG MT LOZG: 1.0000 | LOZENGE | OROMUCOSAL | Status: DC | PRN

## 2017-05-01 MED ORDER — TRANEXAMIC ACID 1000 MG/10ML IV SOLN
1000.0000 mg | INTRAVENOUS | Status: AC
  Administered 2017-05-01: 1000 mg via INTRAVENOUS
  Filled 2017-05-01: qty 1100

## 2017-05-01 MED ORDER — SODIUM CHLORIDE 0.9 % IV SOLN
INTRAVENOUS | Status: DC
  Administered 2017-05-01 – 2017-05-05 (×3): via INTRAVENOUS

## 2017-05-01 MED ORDER — METOCLOPRAMIDE HCL 5 MG/ML IJ SOLN: 5.0000 mg | Freq: Three times a day (TID) | INTRAMUSCULAR | Status: DC | PRN

## 2017-05-01 MED ORDER — ONDANSETRON HCL 4 MG/2ML IJ SOLN
INTRAMUSCULAR | Status: AC
Start: 2017-05-01 — End: 2017-05-01
  Filled 2017-05-01: qty 2

## 2017-05-01 MED ORDER — PROPOFOL 10 MG/ML IV BOLUS
INTRAVENOUS | Status: AC
  Filled 2017-05-01: qty 20

## 2017-05-01 MED ORDER — PHENOL 1.4 % MT LIQD: 1.0000 | OROMUCOSAL | Status: DC | PRN

## 2017-05-01 SURGICAL SUPPLY — 41 items
BIT DRILL SHORT 4.0 (BIT) ×1 IMPLANT
BNDG COHESIVE 4X5 TAN NS LF (GAUZE/BANDAGES/DRESSINGS) IMPLANT
BNDG COHESIVE 6X5 TAN STRL LF (GAUZE/BANDAGES/DRESSINGS) ×9 IMPLANT
BNDG GAUZE ELAST 4 BULKY (GAUZE/BANDAGES/DRESSINGS) IMPLANT
COVER MAYO STAND STRL (DRAPES) ×3 IMPLANT
COVER PERINEAL POST (MISCELLANEOUS) ×3 IMPLANT
COVER SURGICAL LIGHT HANDLE (MISCELLANEOUS) ×3 IMPLANT
DRAPE STERI IOBAN 125X83 (DRAPES) ×3 IMPLANT
DRILL BIT SHORT 4.0 (BIT) ×2
DRSG MEPILEX BORDER 4X4 (GAUZE/BANDAGES/DRESSINGS) ×6 IMPLANT
DRSG MEPILEX BORDER 4X8 (GAUZE/BANDAGES/DRESSINGS) ×3 IMPLANT
DRSG PAD ABDOMINAL 8X10 ST (GAUZE/BANDAGES/DRESSINGS) IMPLANT
DURAPREP 26ML APPLICATOR (WOUND CARE) ×3 IMPLANT
ELECT REM PT RETURN 9FT ADLT (ELECTROSURGICAL)
ELECTRODE REM PT RTRN 9FT ADLT (ELECTROSURGICAL) IMPLANT
GLOVE SKINSENSE NS SZ7.5 (GLOVE) ×4
GLOVE SKINSENSE STRL SZ7.5 (GLOVE) ×2 IMPLANT
GOWN STRL REIN XL XLG (GOWN DISPOSABLE) ×3 IMPLANT
GUIDE PIN 3.2X343 (PIN) ×1
GUIDE PIN 3.2X343MM (PIN) ×2
KIT BASIN OR (CUSTOM PROCEDURE TRAY) ×3 IMPLANT
KIT ROOM TURNOVER OR (KITS) ×3 IMPLANT
MANIFOLD NEPTUNE II (INSTRUMENTS) IMPLANT
NAIL RIGHT 10X38MM (Nail) ×3 IMPLANT
NS IRRIG 1000ML POUR BTL (IV SOLUTION) ×3 IMPLANT
PACK GENERAL/GYN (CUSTOM PROCEDURE TRAY) ×3 IMPLANT
PAD ARMBOARD 7.5X6 YLW CONV (MISCELLANEOUS) ×6 IMPLANT
PAD CAST 4YDX4 CTTN HI CHSV (CAST SUPPLIES) IMPLANT
PADDING CAST COTTON 4X4 STRL (CAST SUPPLIES)
PIN GUIDE 3.2X343MM (PIN) ×1 IMPLANT
SCREW LAG COMPR KIT 95/90 (Screw) ×3 IMPLANT
SCREW TRIGEN LOW PROF 5.0X32.5 (Screw) ×3 IMPLANT
STAPLER VISISTAT 35W (STAPLE) ×3 IMPLANT
STOCKINETTE IMPERVIOUS 9X36 MD (GAUZE/BANDAGES/DRESSINGS) ×3 IMPLANT
SUT VIC AB 0 CT1 27 (SUTURE) ×2
SUT VIC AB 0 CT1 27XBRD ANBCTR (SUTURE) ×1 IMPLANT
SUT VIC AB 2-0 CT1 27 (SUTURE) ×2
SUT VIC AB 2-0 CT1 TAPERPNT 27 (SUTURE) ×1 IMPLANT
TOWEL OR 17X24 6PK STRL BLUE (TOWEL DISPOSABLE) ×3 IMPLANT
TOWEL OR 17X26 10 PK STRL BLUE (TOWEL DISPOSABLE) ×3 IMPLANT
WATER STERILE IRR 1000ML POUR (IV SOLUTION) ×3 IMPLANT

## 2017-05-01 NOTE — Progress Notes (Signed)
[facility] PROGRESS NOTE  Alexis Hamilton XAJ:[id number] DOB: [birth year] DOA: 04/30/2017  PCP: [name], MD  Brief History/Interval Summary: 83 year old Caucasian female with a past medical history of atrial fibrillation on Eliquis and amiodarone, history of mitral valve repair at [facility] in 2010 was transferred from hospital in [place] for further management of right hip fracture and to get neurosurgical input regarding cervical dens fracture noted on cervical CT.   Reason for Visit: Right hip fracture  Consultants: Orthopedics.  Neurosurgery.  Procedures: Plan is for surgery to right hip this afternoon  Antibiotics: None  Subjective/Interval History: States that the pain in the right hip is well controlled.  Denies any nausea vomiting.  Denies any neck pain.  No shortness of breath or chest pain.  Fairly active at baseline.  ROS: Denies any headaches.  Objective:  Vital Signs  Vitals:   04/30/17 1300 04/30/17 1918 05/01/17 0200 05/01/17 0531  BP:  (!) 101/50  (!) 95/48  Pulse:  67  60  Resp:  14 16 16   Temp:  98.2 F (36.8 C)  98 F (36.7 C)  TempSrc:  Oral  Oral  SpO2:  100% 92% 100%  Weight: 49.9 kg (110 lb)     Height: 5\' 7"  (1.702 m)       Intake/Output Summary (Last 24 hours) at 05/01/2017 1308 Last data filed at 05/01/2017 0800 Gross per 24 hour  Intake 520 ml  Output 300 ml  Net 220 ml   Filed Weights   04/30/17 1300  Weight: 49.9 kg (110 lb)    General appearance: alert, cooperative, appears stated age and no distress Head: Normocephalic, without obvious abnormality, atraumatic Noted to be in cervical neck collar. Resp: clear to auscultation bilaterally Cardio: regular rate and rhythm, S1, S2 normal, no murmur, click, rub or gallop GI: soft, non-tender; bowel sounds normal; no masses,  no organomegaly Extremities: Right lower extremity is externally rotated.  Good pulses noted. Neurologic: No focal deficits.  Lab  Results:  Data Reviewed: I have personally reviewed following labs and imaging studies  CBC: Recent Labs  Lab 05/01/17 0744  WBC 6.4  HGB 9.2*  HCT 29.6*  MCV 101.0*  PLT 094    Basic Metabolic Panel: Recent Labs  Lab 04/30/17 1820 05/01/17 0744  NA  --  137  K  --  4.1  CL  --  106  CO2  --  24  GLUCOSE  --  83  BUN  --  25*  CREATININE  --  1.24*  CALCIUM 7.4* 7.7*    GFR: Estimated Creatinine Clearance: 25.7 mL/min (A) (by C-G formula based on SCr of 1.24 mg/dL (H)).  Liver Function Tests: Recent Labs  Lab 04/30/17 1820  ALBUMIN 2.8*    Coagulation Profile: Recent Labs  Lab 04/30/17 1328  INR 1.66     Recent Results (from the past 240 hour(s))  MRSA PCR Screening     Status: Abnormal   Collection Time: 04/30/17  1:40 PM  Result Value Ref Range Status   MRSA by PCR POSITIVE (A) NEGATIVE Final    Comment:        The GeneXpert MRSA Assay (FDA approved for NASAL specimens only), is one component of a comprehensive MRSA colonization surveillance program. It is not intended to diagnose MRSA infection nor to guide or monitor treatment for MRSA infections. RESULT CALLED TO, READ BACK BY AND VERIFIED WITHMinus Liberty RN [id number] 04/30/17 A BROWNING  Radiology Studies: Dg Hip Unilat With Pelvis 2-3 Views Right  Result Date: 05/01/2017 CLINICAL DATA:  Status post fall, with right hip pain. Initial encounter. EXAM: DG HIP (WITH OR WITHOUT PELVIS) 2-3V RIGHT COMPARISON:  None. FINDINGS: There is a comminuted right femoral intertrochanteric fracture, with displaced greater and lesser trochanteric fragments. The right femoral head remains seated at the acetabulum. The left hip joint is unremarkable in appearance. The sacroiliac joints are grossly unremarkable. Calcified uterine fibroids are noted. The visualized bowel gas pattern is grossly unremarkable. Soft tissue swelling is noted at the right hip. IMPRESSION: Comminuted right femoral intertrochanteric  fracture, with displaced greater and lesser trochanteric fragments. Electronically Signed   By: Garald Balding M.D.   On: 05/01/2017 00:37     Medications:  Scheduled: . amiodarone  200 mg Oral Daily  . Chlorhexidine Gluconate Cloth  6 each Topical Q0600  . docusate sodium  100 mg Oral BID  . mupirocin ointment  1 application Nasal BID  . povidone-iodine  2 application Topical Once   Continuous: .  ceFAZolin (ANCEF) IV    . heparin 500 Units/hr (05/01/17 0449)  . tranexamic acid     DXA:JOINOMVEHMC-[id number], morphine injection, polyethylene glycol  Assessment/Plan:  Active Problems:   Atrial fibrillation, chronic (HCC)   History of repair of mitral valve   Displaced intertrochanteric fracture of right femur, initial encounter for closed fracture [facility])    Right intertrochanteric fracture Orthopedics has been consulted.  Plan is for surgical intervention this afternoon.  Pain control.  Low vitamin D noted.  She will need vitamin D replacement at discharge.  Chronic atrial fibrillation Patient is on Eliquis at home.  Also noted to be on amiodarone.  Eliquis has been held for surgery.  Patient started on IV heparin.  Heparin to be held prior to surgery.  Monitor on telemetry.  Age-indeterminate fracture of dens This was seen on CT scan of the cervical spine from outside hospital.  Patient denies any neck pain currently.  Apparently also fell 2 months ago and had fractured her left elbow.  She required surgery for that.  She was in rehabilitation and was discharged from rehab last week.  She was sent home with home health.  Discussed with Dr. Ronnald Ramp with neurosurgery who will evaluate patient tomorrow.  Recommends continuing the cervical neck collar for now.  Macrocytic anemia Baseline hemoglobin not known.  Hemoglobin was around 10 at the outside facility.  9.2 today.  No evidence of overt bleeding.  Check anemia panel.  Hypotension Low blood pressures noted.  Patient is  asymptomatic.  She has not noted on any other blood pressure lowering agents apart from amiodarone.  Continue to monitor for now.  Elevated creatinine Baseline renal function is not known.  She could have chronic kidney disease.  Unable to determine at this time.  Monitor urine output.  Check labs tomorrow.  DVT Prophylaxis: On IV heparin    Code Status: Full code Family Communication: Discussed with the patient and her husband who was at the bedside Disposition Plan: Management as outlined above.  Await surgery.    LOS: 1 day   [facility] Pager [phone] 05/01/2017, 1:08 PM  If 7PM-7AM, please contact night-coverage at [email or web address], password [facility]

## 2017-05-01 NOTE — Consult Note (Signed)
ORTHOPAEDIC CONSULTATION  REQUESTING PHYSICIAN: Alexis Haff, MD  Chief Complaint: Right intertroch hip fracture  HPI: Alexis Hamilton is a 83 y.o. female who presents with right hip fracture s/p mechanical fall PTA.  The patient endorses severe pain in the right hip, that does not radiate, grinding in quality, worse with any movement, better with immobilization.  Denies LOC/fever/chills/nausea/vomiting.  Does live with husband.  Denies LOC, neck pain, abd pain.  Patient has afib for which she is on eliquis and amiodarone.    Past Medical History:  Diagnosis Date  . Bruises easily    d/t being on Coumadin  . Cancer [facility])    right breast cancer  . Dysrhythmia    A Fib-takes Amiodarone daily  . Hypertension   . Mitral valve disorder    Past Surgical History:  Procedure Laterality Date  . BREAST BIOPSY  05/17/2011   Procedure: BREAST BIOPSY WITH NEEDLE LOCALIZATION;  Surgeon: Judieth Keens, DO;  Location: [place];  Service: General;  Laterality: Right;  Right breast needle localized lumpectomy.  Marland Kitchen CARDIAC VALVE SURGERY  2010   ring placed around valve  . CHOLECYSTECTOMY  20+yrs ago  . COLONOSCOPY    . TONSILLECTOMY     as a teenager   Social History   Socioeconomic History  . Marital status: Married    Spouse name: None  . Number of children: None  . Years of education: None  . Highest education level: None  Social Needs  . Financial resource strain: None  . Food insecurity - worry: None  . Food insecurity - inability: None  . Transportation needs - medical: None  . Transportation needs - non-medical: None  Occupational History  . Occupation: retired bookkeeper  Tobacco Use  . Smoking status: Never Smoker  . Smokeless tobacco: Never Used  Substance and Sexual Activity  . Alcohol use: No  . Drug use: No  . Sexual activity: No  Other Topics Concern  . None  Social History Narrative   Daughter Benjamine Mola- [place]   Daughter Marigene Eh[place]    Family History  Problem Relation Age of Onset  . Anesthesia problems Neg Hx   . Hypotension Neg Hx   . Malignant hyperthermia Neg Hx   . Pseudochol deficiency Neg Hx    Allergies  Allergen Reactions  . Sulfa Antibiotics     Childhood reaction unknown   Prior to Admission medications   Medication Sig Start Date End Date Taking? Authorizing Provider  amiodarone (PACERONE) 200 MG tablet Take 200 mg by mouth daily.     Yes [provider]  apixaban (ELIQUIS) 2.5 MG TABS tablet Take 2.5 mg by mouth 2 (two) times daily.   Yes [provider]  Calcium Carbonate-Vitamin D (CALCIUM PLUS VITAMIN D PO) Take 1 tablet by mouth daily.     Yes [provider]  furosemide (LASIX) 40 MG tablet Take 40 mg by mouth daily.     Yes [provider]  Multiple Vitamin (MULTIVITAMIN) tablet Take 1 tablet by mouth daily as needed (supplemental).    Yes [provider]   Dg Hip Unilat With Pelvis 2-3 Views Right  Result Date: 05/01/2017 CLINICAL DATA:  Status post fall, with right hip pain. Initial encounter. EXAM: DG HIP (WITH OR WITHOUT PELVIS) 2-3V RIGHT COMPARISON:  None. FINDINGS: There is a comminuted right femoral intertrochanteric fracture, with displaced greater and lesser trochanteric fragments. The right femoral head remains seated at the acetabulum. The left hip  joint is unremarkable in appearance. The sacroiliac joints are grossly unremarkable. Calcified uterine fibroids are noted. The visualized bowel gas pattern is grossly unremarkable. Soft tissue swelling is noted at the right hip. IMPRESSION: Comminuted right femoral intertrochanteric fracture, with displaced greater and lesser trochanteric fragments. Electronically Signed   By: Garald Balding M.D.   On: 05/01/2017 00:37    All pertinent xrays, MRI, CT independently reviewed and interpreted  Positive ROS: All other systems have been reviewed and were otherwise negative with the exception of those  mentioned in the HPI and as above.  Physical Exam: General: Alert, no acute distress Cardiovascular: No pedal edema Respiratory: No cyanosis, no use of accessory musculature GI: No organomegaly, abdomen is soft and non-tender Skin: No lesions in the area of chief complaint Neurologic: Sensation intact distally Psychiatric: Patient is competent for consent with normal mood and affect Lymphatic: No axillary or cervical lymphadenopathy  MUSCULOSKELETAL:  - pain with movement of the hip and extremity - skin intact - NVI distally - compartments soft  Assessment: Right intertroch hip fracture  Plan: - surgery is recommended, patient and family are aware of r/b/a and wish to proceed - consent obtained - medical optimization per primary team - surgery is planned for this afternoon - heparin drip to stop at 1:30 pm - I have already called pharmacy  Thank you for the consult and the opportunity to see [name] Eduard Roux, MD [place] 9:48 AM

## 2017-05-01 NOTE — Anesthesia Postprocedure Evaluation (Signed)
Anesthesia Post Note  Patient: Alexis Hamilton  Procedure(s) Performed: INTRAMEDULLARY (IM) NAIL INTERTROCHANTRIC (Right )     Patient location during evaluation: PACU Anesthesia Type: General Level of consciousness: awake and alert Pain management: pain level controlled Vital Signs Assessment: post-procedure vital signs reviewed and stable Respiratory status: spontaneous breathing, nonlabored ventilation, respiratory function stable and patient connected to nasal cannula oxygen Cardiovascular status: blood pressure returned to baseline and stable Postop Assessment: no apparent nausea or vomiting Anesthetic complications: no    Last Vitals:  Vitals:   05/01/17 1906 05/01/17 1921  BP: 110/62 (!) 104/58  Pulse: 70 72  Resp: 18 18  Temp:    SpO2: 98% 95%    Last Pain:  Vitals:   05/01/17 1921  TempSrc:   PainSc: 0-No pain                 Godric Lavell,W. EDMOND

## 2017-05-01 NOTE — Progress Notes (Signed)
ANTICOAGULATION CONSULT NOTE  Pharmacy Consult:  Heparin Indication: atrial fibrillation  Allergies  Allergen Reactions  . Sulfa Antibiotics     Childhood reaction unknown    Patient Measurements: Height: 5\' 7"  (170.2 cm) Weight: 110 lb (49.9 kg) IBW/kg (Calculated) : 61.6 Heparin Dosing Weight: 50 kg  Vital Signs: Temp: 98 F (36.7 C) (12/26 0531) Temp Source: Oral (12/26 0531) BP: 95/48 (12/26 0531) Pulse Rate: 60 (12/26 0531)  Labs: Recent Labs    04/30/17 1328  05/01/17 0100 05/01/17 0744 05/01/17 1233  HGB  --   --   --  9.2*  --   HCT  --   --   --  29.6*  --   PLT  --   --   --  168  --   APTT  --    < > 153* 93* 67*  LABPROT 19.5*  --   --   --   --   INR 1.66  --   --   --   --   HEPARINUNFRC  --   --  >2.20* 1.80*  --   CREATININE  --   --   --  1.24*  --    < > = values in this interval not displayed.    Estimated Creatinine Clearance: 25.7 mL/min (A) (by C-G formula based on SCr of 1.24 mg/dL (H)).     Assessment: 56 YOF with history of Afib on Eliquis PTA, last dose 04/29/17 at 1800.  Patient was transitioned to IV heparin for femur fracture surgery today.   APTT therapeutic and toward the low end of normal at 500 units/hr.  Lab obtained prior to holding heparin for surgery.  No bleeding reported.   Goal of Therapy:  Heparin level 0.3-0.7 units/ml aPTT 66 - 102 seconds Monitor platelets by anticoagulation protocol: Yes    Plan:  Heparin currently off for surgery, f/u with plan post-op   Varonica Siharath D. Mina Marble, PharmD, BCPS Pager:  [phone] 05/01/2017, 2:15 PM

## 2017-05-01 NOTE — Anesthesia Procedure Notes (Signed)
Procedure Name: Intubation Date/Time: 05/01/2017 5:02 PM Performed by: White, Amedeo Plenty, CRNA Pre-anesthesia Checklist: Patient identified, Emergency Drugs available, Suction available and Patient being monitored Patient Re-evaluated:Patient Re-evaluated prior to induction Oxygen Delivery Method: Circle System Utilized Preoxygenation: Pre-oxygenation with 100% oxygen Induction Type: IV induction Ventilation: Mask ventilation without difficulty Laryngoscope Size: Glidescope and 3 (elective glidescope due to neck imobilizer) Grade View: Grade I Tube type: Oral Tube size: 7.0 mm Number of attempts: 1 Placement Confirmation: positive ETCO2 Secured at: 20 cm Tube secured with: Tape Dental Injury: Teeth and Oropharynx as per pre-operative assessment

## 2017-05-01 NOTE — Progress Notes (Signed)
ANTICOAGULATION CONSULT NOTE  Pharmacy Consult:  Heparin Indication: atrial fibrillation  Allergies  Allergen Reactions  . Sulfa Antibiotics     Childhood reaction unknown    Patient Measurements: Height: 5\' 7"  (170.2 cm) Weight: 110 lb (49.9 kg) IBW/kg (Calculated) : 61.6 Heparin Dosing Weight: 50 kg  Vital Signs: Temp: 98.2 F (36.8 C) (12/25 1918) Temp Source: Oral (12/25 1918) BP: 101/50 (12/25 1918) Pulse Rate: 67 (12/25 1918)  Labs: Recent Labs    04/30/17 1328 04/30/17 1820 05/01/17 0100  APTT  --  64* 153*  LABPROT 19.5*  --   --   INR 1.66  --   --   HEPARINUNFRC  --   --  >2.20*    CrCl cannot be calculated (Patient's most recent lab result is older than the maximum 21 days allowed.).  Assessment: 83 y.o. female with h/o Afib, Xarelto on hold, for heparin  Goal of Therapy:  Heparin level 0.3-0.7 units/ml aPTT 66 - 102 seconds Monitor platelets by anticoagulation protocol: Yes    Plan:  Hold heparin x 45 min, then decrease heparin 500 units/hr Check aPTT in 8 hours.   Phillis Knack, PharmD, BCPS

## 2017-05-01 NOTE — Op Note (Signed)
   Date of Surgery: 05/01/2017  INDICATIONS: Alexis Hamilton is a 83 y.o.-year-old female who sustained a right hip fracture. The risks and benefits of the procedure discussed with the patient prior to the procedure and all questions were answered; consent was obtained.  PREOPERATIVE DIAGNOSIS: right subtrochanteric hip fracture   POSTOPERATIVE DIAGNOSIS: Same   PROCEDURE: Treatment of subtrochanteric fracture with intramedullary implant. CPT [id number]   SURGEON: N. Eduard Roux, M.D.   ASSISTANT: Tawanna Cooler, PA-C necessary for timely completion of procedure  ANESTHESIA: general   IV FLUIDS AND URINE: See anesthesia record   ESTIMATED BLOOD LOSS: see anesthesia record  IMPLANTS: [employer] InterTAN 10 x 38, 95/90  DRAINS: None.   COMPLICATIONS: None.   DESCRIPTION OF PROCEDURE: The patient was brought to the operating room and placed supine on the operating table. The patient's leg had been signed prior to the procedure. The patient had the anesthesia placed by the anesthesiologist. The prep verification and incision time-outs were performed to confirm that this was the correct patient, site, side and location. The patient had an SCD on the opposite lower extremity. The patient did receive antibiotics prior to the incision and was re-dosed during the procedure as needed at indicated intervals. The patient was positioned on the fracture table with the table in traction and internal rotation to reduce the hip. The fracture segments were reduced into alignment with the use of a cobb elevator and sterile crutch.  The well leg was placed in a scissor position and all bony prominences were well-padded. The patient had the lower extremity prepped and draped in the standard surgical fashion. The incision was made 4 finger breadths superior to the greater trochanter. A guide pin was inserted into the tip of the greater trochanter under fluoroscopic guidance. An opening reamer was used to gain  access to the femoral canal. The nail length was measured and inserted down the femoral canal to its proper depth. The appropriate version of insertion for the lag screw was found under fluoroscopy. A pin was inserted up the femoral neck through the jig. Then, a second antirotation pin was inserted inferior to the first pin. The length of the lag screw was then measured. The lag screw was inserted as near to center-center in the head as possible. The antirotation pin was then taken out and an interdigitating compression screw was placed in its place. The leg was taken out of traction, then the interdigitating compression screw was used to compress across the fracture. Compression was visualized on serial xrays. A single distal interlocking screw was placed using the perfect circle technique.  The wound was copiously irrigated with saline and the subcutaneous layer closed with 2.0 vicryl and the skin was reapproximated with staples. The wounds were cleaned and dried a final time and a sterile dressing was placed. The hip was taken through a range of motion at the end of the case under fluoroscopic imaging to visualize the approach-withdraw phenomenon and confirm implant length in the head. The patient was then awakened from anesthesia and taken to the recovery room in stable condition. All counts were correct at the end of the case.   POSTOPERATIVE PLAN: The patient will be weight bearing as tolerated and will return in 2 weeks for staple removal and the patient will receive DVT prophylaxis based on other medications, activity level, and risk ratio of bleeding to thrombosis.   Alexis Cecil, MD [facility] [phone] 5:56 PM

## 2017-05-01 NOTE — Progress Notes (Signed)
RN assessed pt when arriving to the floor. Pt has a cervical collar and family wanted to see when it could be removed. Also family wants to speak to the neurosurgeon ASAP. Text paged [facility] and Baltazar Najjar put in an order that pt must wear cervical collar for 3 months. NT could not get an oral temp on pt. RN took rectal temp -95.8 F. RN text paged [facility]. Orders to apply warm blankets. Warm blanket applied will recheck temp.

## 2017-05-01 NOTE — Progress Notes (Signed)
Initial Nutrition Assessment  DOCUMENTATION CODES:   Underweight  INTERVENTION:   Once diet advanced, provide medically appropriate nutritional supplement  NUTRITION DIAGNOSIS:   Increased nutrient needs related to hip fracture as evidenced by estimated needs  GOAL:   Patient will meet greater than or equal to 90% of their needs  MONITOR:   PO intake, Supplement acceptance, Weight trends, I & O's  REASON FOR ASSESSMENT:   Consult Hip fracture protocol  ASSESSMENT:   Pt with PMH of HTN, chronic A.Fib, hx of mitral valve repair (2010) presents with right femur fracture and fracture of dens with cervical collar. Pt for surgical intervention 12/26   Spoke with pt and pt's husband at bedside.  Pt reports she has a great appetite at baseline and she is  hungry but is NPO. Per chart, pt consumed 50% of dinner last night and her daughter brought her a pack of crackers.  Pt reports possible weight changes r/t her inability to eat, reporting a UBW of 115 lbs. Pt with no recent weight readings in chart. Last weights recorded from 2013.   Pt willing to consume nutritional supplement once diet is advanced.   Labs reviewed; BUN 25, Creatinine 1.24, Albumin 2.8, Vitamin D 26 Medications reviewed; Colace  NUTRITION - FOCUSED PHYSICAL EXAM:    Most Recent Value  Orbital Region  No depletion  Upper Arm Region  No depletion  Thoracic and Lumbar Region  No depletion  Buccal Region  Mild depletion  Temple Region  Mild depletion  Clavicle Bone Region  Moderate depletion  Clavicle and Acromion Bone Region  Moderate depletion  Scapular Bone Region  Unable to assess  Dorsal Hand  Moderate depletion  Patellar Region  Moderate depletion  Anterior Thigh Region  Moderate depletion  Posterior Calf Region  Moderate depletion  Edema (RD Assessment)  None      Diet Order:  Diet NPO time specified  EDUCATION NEEDS:   No education needs have been identified at this time  Skin:  Skin  Assessment: Reviewed RN Assessment  Last BM:  04/29/17  Height:   Ht Readings from Last 1 Encounters:  04/30/17 5\' 7"  (1.702 m)    Weight:   Wt Readings from Last 1 Encounters:  04/30/17 110 lb (49.9 kg)    Ideal Body Weight:  61.4 kg  BMI:  Body mass index is 17.23 kg/m.  Estimated Nutritional Needs:   Kcal:  1400-1600  Protein:  70-80 grams  Fluid:  >/= 1.4 L/d  Parks Ranger, MS, RDN, LDN 05/01/2017 1:05 PM

## 2017-05-01 NOTE — Anesthesia Preprocedure Evaluation (Addendum)
Anesthesia Evaluation  Patient identified by MRN, date of birth, ID band Patient awake    Reviewed: Allergy & Precautions, H&P , Patient's Chart, lab work & pertinent test results, reviewed documented beta blocker date and time   Airway Mallampati: II  TM Distance: >3 FB Neck ROM: Limited   Comment: Collar in place Dental no notable dental hx. (+) Dental Advisory Given   Pulmonary    Pulmonary exam normal breath sounds clear to auscultation       Cardiovascular hypertension,  Rhythm:regular Rate:Normal     Neuro/Psych    GI/Hepatic   Endo/Other    Renal/GU      Musculoskeletal   Abdominal   Peds  Hematology   Anesthesia Other Findings   Reproductive/Obstetrics                            Anesthesia Physical Anesthesia Plan  ASA: II  Anesthesia Plan: General   Post-op Pain Management:    Induction: Intravenous  PONV Risk Score and Plan: 1 and Treatment may vary due to age or medical condition and Ondansetron  Airway Management Planned: Oral ETT and Video Laryngoscope Planned  Additional Equipment:   Intra-op Plan:   Post-operative Plan: Extubation in OR  Informed Consent: I have reviewed the patients History and Physical, chart, labs and discussed the procedure including the risks, benefits and alternatives for the proposed anesthesia with the patient or authorized representative who has indicated his/her understanding and acceptance.   Dental Advisory Given  Plan Discussed with: CRNA and Surgeon  Anesthesia Plan Comments: (Neck fracture - age indeterminate- Glidescope  )        Anesthesia Quick Evaluation

## 2017-05-01 NOTE — H&P (Signed)

## 2017-05-01 NOTE — Transfer of Care (Signed)
Immediate Anesthesia Transfer of Care Note  Patient: Alexis Hamilton  Procedure(s) Performed: INTRAMEDULLARY (IM) NAIL INTERTROCHANTRIC (Right )  Patient Location: PACU  Anesthesia Type:General  Level of Consciousness: awake, alert , oriented and patient cooperative  Airway & Oxygen Therapy: Patient Spontanous Breathing and Patient connected to nasal cannula oxygen  Post-op Assessment: Report given to RN and Post -op Vital signs reviewed and stable  Post vital signs: Reviewed and stable  Last Vitals:  Vitals:   05/01/17 0200 05/01/17 0531  BP:  (!) 95/48  Pulse:  60  Resp: 16 16  Temp:  36.7 C  SpO2: 92% 100%    Last Pain:  Vitals:   05/01/17 0700  TempSrc:   PainSc: 2          Complications: No apparent anesthesia complications

## 2017-05-01 NOTE — Progress Notes (Addendum)
PT has a temp of 95.2 F Oral. Pt refuses to have rectal temp taken and oral temp take again.Pt wanted Nurse to leave the room and wanted "someone who knew what they were doing to come in." Nurse tried  get other nurses on the floor to go in to pt's room to calm her down but pt refused everyone and told them to get out of her room and get her a MD. Pt told nurse tech that she felt like she was having a stroke so Nurse went into the pt's room and assessed for a stroke. Pt said that the hand grip and pronator drift was ridiculous and that the Nurse needed to leave her room. Nurse educated pt that this is how we assess for stroke. Pt said she figuratively speaking when she said she was having a stroke. Nurse called [facility] and AC told her to call [facility]. Nurse called [facility] Nurse notified [facility].

## 2017-05-02 ENCOUNTER — Inpatient Hospital Stay (HOSPITAL_COMMUNITY): Payer: Medicare Other

## 2017-05-02 ENCOUNTER — Encounter (HOSPITAL_COMMUNITY): Payer: Self-pay | Admitting: Orthopaedic Surgery

## 2017-05-02 LAB — CBC
HCT: 25.2 % — ABNORMAL LOW (ref 36.0–46.0)
Hemoglobin: 7.9 g/dL — ABNORMAL LOW (ref 12.0–15.0)
MCH: 31.6 pg (ref 26.0–34.0)
MCHC: 31.3 g/dL (ref 30.0–36.0)
MCV: 100.8 fL — AB (ref 78.0–100.0)
PLATELETS: 155 10*3/uL (ref 150–400)
RBC: 2.5 MIL/uL — AB (ref 3.87–5.11)
RDW: 16.7 % — AB (ref 11.5–15.5)
WBC: 9.3 10*3/uL (ref 4.0–10.5)

## 2017-05-02 LAB — BASIC METABOLIC PANEL
Anion gap: 11 (ref 5–15)
BUN: 24 mg/dL — AB (ref 6–20)
CO2: 21 mmol/L — AB (ref 22–32)
Calcium: 7.7 mg/dL — ABNORMAL LOW (ref 8.9–10.3)
Chloride: 102 mmol/L (ref 101–111)
Creatinine, Ser: 1.31 mg/dL — ABNORMAL HIGH (ref 0.44–1.00)
GFR calc Af Amer: 41 mL/min — ABNORMAL LOW (ref >60)
GFR, EST NON AFRICAN AMERICAN: 36 mL/min — AB (ref >60)
GLUCOSE: 155 mg/dL — AB (ref 65–99)
POTASSIUM: 4.1 mmol/L (ref 3.5–5.1)
Sodium: 134 mmol/L — ABNORMAL LOW (ref 135–145)

## 2017-05-02 LAB — FERRITIN: FERRITIN: 172 ng/mL (ref 11–307)

## 2017-05-02 LAB — IRON AND TIBC
Iron: 17 ug/dL — ABNORMAL LOW (ref 28–170)
Saturation Ratios: 8 % — ABNORMAL LOW (ref 10.4–31.8)
TIBC: 203 ug/dL — AB (ref 250–450)
UIBC: 186 ug/dL

## 2017-05-02 LAB — APTT: aPTT: 39 seconds — ABNORMAL HIGH (ref 24–36)

## 2017-05-02 LAB — RETICULOCYTES
RBC.: 2.5 MIL/uL — ABNORMAL LOW (ref 3.87–5.11)
RETIC CT PCT: 2.4 % (ref 0.4–3.1)
Retic Count, Absolute: 60 10*3/uL (ref 19.0–186.0)

## 2017-05-02 LAB — FOLATE: Folate: 18.7 ng/mL (ref >5.9)

## 2017-05-02 LAB — VITAMIN B12: Vitamin B-12: 321 pg/mL (ref 180–914)

## 2017-05-02 MED ORDER — HALOPERIDOL LACTATE 5 MG/ML IJ SOLN
1.0000 mg | Freq: Four times a day (QID) | INTRAMUSCULAR | Status: DC | PRN
  Administered 2017-05-02: 1 mg via INTRAVENOUS
  Filled 2017-05-02: qty 1

## 2017-05-02 MED ORDER — ENSURE ENLIVE PO LIQD
237.0000 mL | Freq: Two times a day (BID) | ORAL | Status: DC
  Administered 2017-05-02 – 2017-05-06 (×2): 237 mL via ORAL

## 2017-05-02 NOTE — Progress Notes (Signed)
[facility] PROGRESS NOTE  AURY SCOLLARD MVE:[id number] DOB: May 28, 1928 DOA: 04/30/2017  PCP: [name], MD  Brief History/Interval Summary: 83 year old Caucasian female with a past medical history of atrial fibrillation on Eliquis and amiodarone, history of mitral valve repair at [facility] in 2010 was transferred from hospital in [place] for further management of right hip fracture and to get neurosurgical input regarding cervical dens fracture noted on cervical CT.   Reason for Visit: Right hip fracture  Consultants: Orthopedics.  Neurosurgery.  Procedures: Treatment of right subtrochanteric fracture with intramedullary implant  Antibiotics: None  Subjective/Interval History: Patient feels well.  Pain in the right lower extremity is reasonably well controlled.  Denies any neck pain.  No nausea or vomiting.  Events of last night noted.    ROS: Denies any headaches.  Objective:  Vital Signs  Vitals:   05/02/17 0021 05/02/17 0120 05/02/17 0546 05/02/17 0858  BP:   (!) 106/52 (!) 95/42  Pulse:   76 70  Resp:   18   Temp: (!) 95.8 F (35.4 C) (!) 97.5 F (36.4 C) 97.8 F (36.6 C) 97.8 F (36.6 C)  TempSrc: Oral Oral Oral Oral  SpO2:   94% 95%  Weight:      Height:        Intake/Output Summary (Last 24 hours) at 05/02/2017 1317 Last data filed at 05/01/2017 1811 Gross per 24 hour  Intake 840 ml  Output 250 ml  Net 590 ml   Filed Weights   04/30/17 1300  Weight: 49.9 kg (110 lb)    General appearance: Awake alert.  No distress. Noted to be in cervical neck collar. Resp: Clear to auscultation bilaterally.  No wheezing rales or rhonchi Cardio: S1-S2 is regular.  No S3-S4.  No rubs murmurs or bruit GI: Abdomen is soft.  Nontender nondistended. Extremities: No bruising noted over the right thigh. Neurologic: No focal deficits.  Lab Results:  Data Reviewed: I have personally reviewed following labs and imaging  studies  CBC: Recent Labs  Lab 05/01/17 0744 05/02/17 0957  WBC 6.4 9.3  HGB 9.2* 7.9*  HCT 29.6* 25.2*  MCV 101.0* 100.8*  PLT 168 283    Basic Metabolic Panel: Recent Labs  Lab 04/30/17 1820 05/01/17 0744 05/02/17 0957  NA  --  137 134*  K  --  4.1 4.1  CL  --  106 102  CO2  --  24 21*  GLUCOSE  --  83 155*  BUN  --  25* 24*  CREATININE  --  1.24* 1.31*  CALCIUM 7.4* 7.7* 7.7*    GFR: Estimated Creatinine Clearance: 24.3 mL/min (A) (by C-G formula based on SCr of 1.31 mg/dL (H)).  Liver Function Tests: Recent Labs  Lab 04/30/17 1820  ALBUMIN 2.8*    Coagulation Profile: Recent Labs  Lab 04/30/17 1328  INR 1.66     Recent Results (from the past 240 hour(s))  MRSA PCR Screening     Status: Abnormal   Collection Time: 04/30/17  1:40 PM  Result Value Ref Range Status   MRSA by PCR POSITIVE (A) NEGATIVE Final    Comment:        The GeneXpert MRSA Assay (FDA approved for NASAL specimens only), is one component of a comprehensive MRSA colonization surveillance program. It is not intended to diagnose MRSA infection nor to guide or monitor treatment for MRSA infections. RESULT CALLED TO, READ BACK BY AND VERIFIED WITHMinus Liberty RN [id number] 04/30/17 A BROWNING  Radiology Studies: Dg C-arm 1-60 Min  Result Date: 05/01/2017 CLINICAL DATA:  Intramedullary (Im) Nail intertrochantric Right femur. EXAM: RIGHT FEMUR 2 VIEWS; DG C-ARM 61-120 MIN COMPARISON:  None. FINDINGS: Four intraoperative fluoroscopic spot images of the right hip show fixation of the right femoral neck fracture with intramedullary rod and dynamic hip screws. Osseous alignment is anatomic. Hardware appears intact and appropriately positioned. Fluoroscopy was provided for 2 minutes and 2 seconds. IMPRESSION: Internal fixation hardware traversing the right femoral neck fracture site appears intact and appropriately position. No evidence of surgical complicating feature. Electronically  Signed   By: Franki Cabot M.D.   On: 05/01/2017 19:59   Dg Hip Unilat With Pelvis 2-3 Views Right  Result Date: 05/01/2017 CLINICAL DATA:  Status post fall, with right hip pain. Initial encounter. EXAM: DG HIP (WITH OR WITHOUT PELVIS) 2-3V RIGHT COMPARISON:  None. FINDINGS: There is a comminuted right femoral intertrochanteric fracture, with displaced greater and lesser trochanteric fragments. The right femoral head remains seated at the acetabulum. The left hip joint is unremarkable in appearance. The sacroiliac joints are grossly unremarkable. Calcified uterine fibroids are noted. The visualized bowel gas pattern is grossly unremarkable. Soft tissue swelling is noted at the right hip. IMPRESSION: Comminuted right femoral intertrochanteric fracture, with displaced greater and lesser trochanteric fragments. Electronically Signed   By: Garald Balding M.D.   On: 05/01/2017 00:37   Dg Femur, Min 2 Views Right  Result Date: 05/01/2017 CLINICAL DATA:  Intramedullary (Im) Nail intertrochantric Right femur. EXAM: RIGHT FEMUR 2 VIEWS; DG C-ARM 61-120 MIN COMPARISON:  None. FINDINGS: Four intraoperative fluoroscopic spot images of the right hip show fixation of the right femoral neck fracture with intramedullary rod and dynamic hip screws. Osseous alignment is anatomic. Hardware appears intact and appropriately positioned. Fluoroscopy was provided for 2 minutes and 2 seconds. IMPRESSION: Internal fixation hardware traversing the right femoral neck fracture site appears intact and appropriately position. No evidence of surgical complicating feature. Electronically Signed   By: Franki Cabot M.D.   On: 05/01/2017 19:59     Medications:  Scheduled: . amiodarone  200 mg Oral Daily  . apixaban  2.5 mg Oral BID  . Chlorhexidine Gluconate Cloth  6 each Topical Q0600  . docusate sodium  100 mg Oral BID  . feeding supplement (ENSURE ENLIVE)  237 mL Oral BID BM  . mupirocin ointment  1 application Nasal BID    Continuous: . sodium chloride 50 mL/hr at 05/02/17 0831  .  ceFAZolin (ANCEF) IV Stopped (05/02/17 1124)  . lactated ringers 10 mL/hr at 05/01/17 1520  . methocarbamol (ROBAXIN)  IV     FUX:NATFTDDUKGURK **OR** acetaminophen, alum & mag hydroxide-simeth, HYDROcodone-acetaminophen, menthol-cetylpyridinium **OR** phenol, methocarbamol **OR** methocarbamol (ROBAXIN)  IV, metoCLOPramide **OR** metoCLOPramide (REGLAN) injection, morphine injection, ondansetron **OR** ondansetron (ZOFRAN) IV, oxyCODONE, polyethylene glycol  Assessment/Plan:  Active Problems:   Atrial fibrillation, chronic (HCC)   History of repair of mitral valve   Closed subtrochanteric fracture of hip, right, initial encounter [facility])    Right intertrochanteric fracture Patient underwent surgery on 12/26.  Seems to be stable postoperatively.  She did have periods of confusion overnight which appears to have resolved this morning.  Pain control.  PT and OT evaluation.  Low vitamin D noted.  She will need vitamin D replacement at discharge.  Chronic atrial fibrillation Patient is on Eliquis at home.  Also noted to be on amiodarone.  Eliquis was held for surgery.  Patient was placed on IV heparin.  Now back on Eliquis.    Age-indeterminate fracture of dens This was seen on CT scan of the cervical spine from outside hospital.  Patient denies any neck pain currently.  Apparently also fell 2 months ago and had fractured her left elbow.  She required surgery for that.  She was in rehabilitation and was discharged from rehab last week.  She was sent home with home health.  Discussed with Dr. Ronnald Ramp with neurosurgery who will evaluate patient today.  Recommends continuing the cervical neck collar for now.  Macrocytic anemia/acute blood loss anemia Baseline hemoglobin not known.  Hemoglobin was around 10 at the outside facility.  Hemoglobin has dropped to 7.9.  Some of this could be due to operative blood loss.  Continue to monitor for  now.  Transfuse if it drops below 7.  Anemia panel reviewed.  Ferritin 172.  B12 321.  Folate 18.7.    Hypotension Low blood pressures noted.  Patient is asymptomatic.  She has not noted on any other blood pressure lowering agents apart from amiodarone.  Could be baseline for her.  Continue to monitor for now.  Elevated creatinine Baseline renal function is not known.  She could have chronic kidney disease.  Unable to determine at this time.  Monitor urine output.  Creatinine noted to be slightly higher today.  Monitor urine output.  Repeat labs tomorrow.  DVT Prophylaxis: On Eliquis Code Status: Full code Family Communication: Discussed with patient and her husband. Disposition Plan: Management as outlined above.  Await neurosurgery input.  PT and OT evaluation.  Will most likely need to go to rehab.    LOS: 2 days   [facility] Pager [phone] 05/02/2017, 1:17 PM  If 7PM-7AM, please contact night-coverage at [email or web address], password [email or web address]

## 2017-05-02 NOTE — Progress Notes (Signed)
PT Cancellation Note  Patient Details Name: MARLEN KOMAN MRN: [id number] DOB: 1928/11/19   Cancelled Treatment:     Attempted to eval patient at 1250, pt currently leaving room for CT scan, will visit later this PM.  Reinaldo Berber, PT, DPT Acute Rehab Services Pager: [phone]   Reinaldo Berber 05/02/2017, 12:54 PM

## 2017-05-02 NOTE — Progress Notes (Signed)
Rechecked Pt temp with [facility] and got and Temp 97.5 oral. Notified MD and told pt would let her get some rest.

## 2017-05-02 NOTE — Consult Note (Signed)
Reason for Consult:cervical fx Referring Physician:   [name] is an 83 y.o. female.   HPI:  83 year old fell 2 days ago and was seen in an outside hospital. CT scan there suggested an age indeterminate C1, C2 fx. Pt has had multiple falls, had ORIF of hip yesterday. Denies any neck or arm pain. No NTW down arms.   Past Medical History:  Diagnosis Date  . Bruises easily    d/t being on Coumadin  . Cancer [facility])    right breast cancer  . Dysrhythmia    A Fib-takes Amiodarone daily  . Femur fracture, right ([place]) 04/30/2017  . Hypertension   . Mitral valve disorder     Past Surgical History:  Procedure Laterality Date  . BREAST BIOPSY  05/17/2011   Procedure: BREAST BIOPSY WITH NEEDLE LOCALIZATION;  Surgeon: Judieth Keens, DO;  Location: [place];  Service: General;  Laterality: Right;  Right breast needle localized lumpectomy.  [facility] CARDIAC VALVE SURGERY  2010   ring placed around valve  . CHOLECYSTECTOMY  20+yrs ago  . COLONOSCOPY    . TONSILLECTOMY     as a teenager    Allergies  Allergen Reactions  . Sulfa Antibiotics     Childhood reaction unknown    Social History   Tobacco Use  . Smoking status: Never Smoker  . Smokeless tobacco: Never Used  Substance Use Topics  . Alcohol use: No    Family History  Problem Relation Age of Onset  . Anesthesia problems Neg Hx   . Hypotension Neg Hx   . Malignant hyperthermia Neg Hx   . Pseudochol deficiency Neg Hx      Review of Systems  Positive ROS:   All other systems have been reviewed and were otherwise negative with the exception of those mentioned in the HPI and as above.  Objective: Vital signs in last 24 hours: Temp:  [95.2 F (35.1 C)-97.8 F (36.6 C)] 97.8 F (36.6 C) (12/27 0858) Pulse Rate:  [67-76] 70 (12/27 0858) Resp:  [14-20] 18 (12/27 0546) BP: (94-110)/(42-67) 95/42 (12/27 0858) SpO2:  [94 %-100 %] 95 % (12/27 0858)  General Appearance: Alert, cooperative, no distress, appears stated  age Head: Normocephalic, without obvious abnormality, atraumatic Eyes: not tested Throat: benign Neck: Supple, symmetrical, trachea midline Lungs:  respirations unlabored Heart: Regular rate and rhythm Extremities: Extremities normal, atraumatic, no cyanosis or edema Skin: Skin color, texture, turgor normal, no rashes or lesions  NEUROLOGIC:   Mental status: A&O x4, no aphasia, good attention span, Memory and fund of knowledge Motor Exam - grossly normal, normal tone and bulk Sensory Exam - grossly normal Coordination - grossly normal Gait - not tested Balance - not tested Cranial Nerves: I: smell Not tested  II: visual acuity  OS: NA  OD: NA  II: visual fields Full to confrontation  II: pupils Equal, round, reactive to light  III,VII: ptosis None  III,IV,VI: extraocular muscles  Full ROM  V: mastication   V: facial light touch sensation    V,VII: corneal reflex    VII: facial muscle function - upper    VII: facial muscle function - lower   VIII: hearing   IX: soft palate elevation    IX,X: gag reflex   XI: trapezius strength  5/5  XI: sternocleidomastoid strength 5/5  XI: neck flexion strength  5/5  XII: tongue strength      Data Review Lab Results  Component Value Date   WBC 6.4  05/01/2017   HGB 9.2 (L) 05/01/2017   HCT 29.6 (L) 05/01/2017   MCV 101.0 (H) 05/01/2017   PLT 168 05/01/2017   Lab Results  Component Value Date   NA 137 05/01/2017   K 4.1 05/01/2017   CL 106 05/01/2017   CO2 24 05/01/2017   BUN 25 (H) 05/01/2017   CREATININE 1.24 (H) 05/01/2017   GLUCOSE 83 05/01/2017   Lab Results  Component Value Date   INR 1.66 04/30/2017    Radiology: Dg C-arm 1-60 Min  Result Date: 05/01/2017 CLINICAL DATA:  Intramedullary (Im) Nail intertrochantric Right femur. EXAM: RIGHT FEMUR 2 VIEWS; DG C-ARM 61-120 MIN COMPARISON:  None. FINDINGS: Four intraoperative fluoroscopic spot images of the right hip show fixation of the right femoral neck fracture with  intramedullary rod and dynamic hip screws. Osseous alignment is anatomic. Hardware appears intact and appropriately positioned. Fluoroscopy was provided for 2 minutes and 2 seconds. IMPRESSION: Internal fixation hardware traversing the right femoral neck fracture site appears intact and appropriately position. No evidence of surgical complicating feature. Electronically Signed   By: Franki Cabot M.D.   On: 05/01/2017 19:59   Dg Hip Unilat With Pelvis 2-3 Views Right  Result Date: 05/01/2017 CLINICAL DATA:  Status post fall, with right hip pain. Initial encounter. EXAM: DG HIP (WITH OR WITHOUT PELVIS) 2-3V RIGHT COMPARISON:  None. FINDINGS: There is a comminuted right femoral intertrochanteric fracture, with displaced greater and lesser trochanteric fragments. The right femoral head remains seated at the acetabulum. The left hip joint is unremarkable in appearance. The sacroiliac joints are grossly unremarkable. Calcified uterine fibroids are noted. The visualized bowel gas pattern is grossly unremarkable. Soft tissue swelling is noted at the right hip. IMPRESSION: Comminuted right femoral intertrochanteric fracture, with displaced greater and lesser trochanteric fragments. Electronically Signed   By: Garald Balding M.D.   On: 05/01/2017 00:37   Dg Femur, Min 2 Views Right  Result Date: 05/01/2017 CLINICAL DATA:  Intramedullary (Im) Nail intertrochantric Right femur. EXAM: RIGHT FEMUR 2 VIEWS; DG C-ARM 61-120 MIN COMPARISON:  None. FINDINGS: Four intraoperative fluoroscopic spot images of the right hip show fixation of the right femoral neck fracture with intramedullary rod and dynamic hip screws. Osseous alignment is anatomic. Hardware appears intact and appropriately positioned. Fluoroscopy was provided for 2 minutes and 2 seconds. IMPRESSION: Internal fixation hardware traversing the right femoral neck fracture site appears intact and appropriately position. No evidence of surgical complicating feature.  Electronically Signed   By: Franki Cabot M.D.   On: 05/01/2017 19:59     Assessment/Plan: 83 year old sustained a fall 2 days ago, had CT down at an outside hospital. We are unable to view the images. Will get a repeat CT of her neck. Keep in collar.    Alexis Hamilton 05/02/2017 9:42 AM

## 2017-05-02 NOTE — Evaluation (Signed)
Physical Therapy Evaluation Patient Details Name: Alexis Hamilton MRN: [id number] DOB: [birth year] Today's Date: 05/02/2017   History of Present Illness  83 year old Caucasian female with a past medical history of atrial fibrillation on Eliquis and amiodarone, history of mitral valve repair was transferred from outside hospital for further management of right hip fracture s/p mechanical fall and to get neurosurgical input regarding cervical dens fracture noted on cervical CT. CT scan shows Healing nondisplaced type 2 dens fracture. Incomplete anterior arch of C1 centrally and probable remote nondisplaced fracture on the right. Pt is now s/p IM nail R hip fracture 12/26     Clinical Impression  Patient is s/p above surgery resulting in functional limitations due to the deficits listed below (see PT Problem List). PTA, pt was living at home with husband after leaving SNF for about 1 week ambulating with quad cane when she fell walking to bathroom. Upon eval, patient presents with severe post op pain greatly limiting her mobility, as well as discomfort in elbow and cervical area. Patient has generalized weakness and deconditioning, and is a falls risk at this time. Husband reports he feels unsafe with her returning home and recommending SNF level care. Currently Max-Total assist for mobility.  Patient will benefit from skilled PT to increase their independence and safety with mobility to allow discharge to the venue listed below.       Follow Up Recommendations SNF;Supervision/Assistance - 24 hour    Equipment Recommendations  Rolling walker with 5" wheels;3in1 (PT)    Recommendations for Other Services       Precautions / Restrictions Precautions Precautions: Fall Precaution Comments: hx of falls, post op pain/weakness Restrictions Weight Bearing Restrictions: Yes RLE Weight Bearing: Weight bearing as tolerated      Mobility  Bed Mobility Overal bed mobility: Needs Assistance Bed  Mobility: Supine to Sit;Sit to Supine     Supine to sit: Max assist;+2 for physical assistance;+2 for safety/equipment Sit to supine: Max assist;+2 for physical assistance;+2 for safety/equipment   General bed mobility comments: Pt able to progress LLE towards EOB, requires assist for RLE and to bring trunk upright into sitting; assist for trunk and bil LEs when returning to supine   Transfers Overall transfer level: Needs assistance Equipment used: 2 person hand held assist Transfers: Sit to/from [employer] Transfers Sit to Stand: Max assist;+2 physical assistance;+2 safety/equipment Stand pivot transfers: Total assist;+2 physical assistance;+2 safety/equipment       General transfer comment: Assist to rise with MaxA +2, Pt with increased pain and unable to reach full standing posture; Pt requires total assist +2 to pivot EOB<>BSC.   Ambulation/Gait             General Gait Details: unable   Stairs            Wheelchair Mobility    Modified Rankin (Stroke Patients Only)       Balance Overall balance assessment: Needs assistance Sitting-balance support: Feet supported Sitting balance-Leahy Scale: Fair     Standing balance support: Bilateral upper extremity supported Standing balance-Leahy Scale: Zero                               Pertinent Vitals/Pain Pain Assessment: Faces Faces Pain Scale: Hurts whole lot Pain Location: RLE  Pain Descriptors / Indicators: Aching;Crying;Discomfort Pain Intervention(s): Limited activity within patient's tolerance;Monitored during session;Repositioned;Ice applied    Home Living Family/patient expects to be discharged to:: Skilled nursing facility  Prior Function Level of Independence: Needs assistance   Gait / Transfers Assistance Needed: ambulating with quad cane  ADL's / Homemaking Assistance Needed: some assist for ADLs/iADLs  Comments: had just discharged home from  recent SNF stay      Hand Dominance        Extremity/Trunk Assessment   Upper Extremity Assessment Upper Extremity Assessment: Generalized weakness LUE Deficits / Details: decreased AROM in shoulder     Lower Extremity Assessment Lower Extremity Assessment: Generalized weakness(RLE unable to assess due to pain. 3/5 LLE strength)    Cervical / Trunk Assessment Cervical / Trunk Assessment: Kyphotic  Communication   Communication: HOH  Cognition Arousal/Alertness: Awake/alert Behavior During Therapy: WFL for tasks assessed/performed Overall Cognitive Status: Impaired/Different from baseline                                 General Comments: Pt with difficulty follow commands, though suspect partly due to [facility]; requires repetition and multimodal cues      General Comments General comments (skin integrity, edema, etc.): Patient c/o of discomfort with neck brace    Exercises Total Joint Exercises Ankle Circles/Pumps: AROM;Both;20 reps Long Arc Quad: AROM;Both;10 reps   Assessment/Plan    PT Assessment Patient needs continued PT services  PT Problem List Decreased strength;Decreased range of motion;Decreased activity tolerance;Decreased balance;Decreased mobility;Decreased knowledge of use of DME;Decreased coordination;Pain       PT Treatment Interventions DME instruction;Gait training;Stair training;Functional mobility training;Therapeutic activities;Therapeutic exercise;Balance training    PT Goals (Current goals can be found in the Care Plan section)  Acute Rehab PT Goals Patient Stated Goal: return home  PT Goal Formulation: With patient Time For Goal Achievement: 05/09/17 Potential to Achieve Goals: Poor    Frequency Min 3X/week   Barriers to discharge        Co-evaluation PT/OT/SLP Co-Evaluation/Treatment: Yes Reason for Co-Treatment: For patient/therapist safety PT goals addressed during session: Mobility/safety with mobility;Proper use of  DME;Strengthening/ROM         AM-PAC PT "6 Clicks" Daily Activity  Outcome Measure Difficulty turning over in bed (including adjusting bedclothes, sheets and blankets)?: Unable Difficulty moving from lying on back to sitting on the side of the bed? : Unable Difficulty sitting down on and standing up from a chair with arms (e.g., wheelchair, bedside commode, etc,.)?: Unable Help needed moving to and from a bed to chair (including a wheelchair)?: Total Help needed walking in hospital room?: Total Help needed climbing 3-5 steps with a railing? : Total 6 Click Score: 6    End of Session Equipment Utilized During Treatment: Gait belt Activity Tolerance: Patient limited by pain;Patient limited by lethargy Patient left: in bed;with call bell/phone within reach;with family/visitor present Nurse Communication: Mobility status PT Visit Diagnosis: Unsteadiness on feet (R26.81);Other abnormalities of gait and mobility (R26.89);Repeated falls (R29.6);Muscle weakness (generalized) (M62.81);History of falling (Z91.81);Pain Pain - Right/Left: Right Pain - part of body: Hip    Time: 4034-7425 PT Time Calculation (min) (ACUTE ONLY): 23 min   Charges:   PT Evaluation $PT Eval Moderate Complexity: 1 Mod     PT G Codes:        Reinaldo Berber, PT, DPT Acute Rehab Services Pager: [phone]   Reinaldo Berber 05/02/2017, 6:30 PM

## 2017-05-02 NOTE — Progress Notes (Signed)
Neurosurgery called about patient's mobility restrictions in regards to cervical fx. Awaiting response.

## 2017-05-02 NOTE — Significant Event (Signed)
Rapid Response Event Note  Call Time: 6644 Arrival Time: 102 End Time: 200  Called by primary RN about patient having low temperature, temperature was checked using multiple device and locations, temp was reading 95.8 (R) at 2241, 95.2 (O) at 2344, 95.8 (O)at 0021.    Per RN, she did inform primary service, received orders to use warm blankets to warm patient and to follow up.  Patient did not want warm blankets, did not want the heat to be on, did not want any more temperature and was very frustrated when I walked.  She stated she felt warm not cold.   Patient was alert, able to communicate, and was very upset about her care.  Per patient, she stated: "she was not communicated with, she was told that the equipment was not working, she felt like no one was doing anything for her, and she was uncomfortable when they turned to obtain a rectal temperature prior to my arrival.    I asked her if she would like to be repositioned or if she needed pain medication, both to which she answered, "I am the patient, you are the nurse, you tell me"  I asked if it was okay to recheck an oral temperature, it was 97.5. Heart/lungs sounds normal, VSS ( SBP 90s, have been prior, patient not symptomatic, HR in the 70-80s SR 1HB, sats maintaining > 95% on 2L. + pulses, extremities warm to touch, + sensation to all extremities, patient is able to spontaneous move extremities.   NO RRT INTERVENTIONS.  Plan: nursing staff to communicate with patient as needed when intervening.

## 2017-05-02 NOTE — Progress Notes (Signed)
OT Cancellation Note  Patient Details Name: Alexis Hamilton MRN: [id number] DOB: 01-Dec-1928   Cancelled Treatment:    Reason Eval/Treat Not Completed: Patient at procedure or test/ unavailable; Pt currently being taken down to CT. Will re-attempt OT eval at a later time as schedule permits.  Lou Cal, OT Pager [phone] 05/02/2017   Raymondo Band 05/02/2017, 12:56 PM

## 2017-05-02 NOTE — Discharge Instructions (Signed)
° ° °  1. Change dressings as needed 2. May shower but keep incisions covered and dry 3. Take eliquis to prevent blood clots 4. Take stool softeners as needed 5. Take pain meds as needed    Information on my medicine - ELIQUIS (apixaban)  This medication education was reviewed with me or my healthcare representative as part of my discharge preparation.  The pharmacist that spoke with me during my hospital stay was:  Saundra Shelling, [facility]  Why was Eliquis prescribed for you? Eliquis was prescribed for you to reduce the risk of blood clots forming after orthopedic surgery.    What do You need to know about Eliquis? Take your Eliquis TWICE DAILY - one tablet in the morning and one tablet in the evening with or without food.  It would be best to take the dose about the same time each day.  If you have difficulty swallowing the tablet whole please discuss with your pharmacist how to take the medication safely.  Take Eliquis exactly as prescribed by your doctor and DO NOT stop taking Eliquis without talking to the doctor who prescribed the medication.  Stopping without other medication to take the place of Eliquis may increase your risk of developing a clot.  After discharge, you should have regular check-up appointments with your healthcare provider that is prescribing your Eliquis.  What do you do if you miss a dose? If a dose of ELIQUIS is not taken at the scheduled time, take it as soon as possible on the same day and twice-daily administration should be resumed.  The dose should not be doubled to make up for a missed dose.  Do not take more than one tablet of ELIQUIS at the same time.  Important Safety Information A possible side effect of Eliquis is bleeding. You should call your healthcare provider right away if you experience any of the following: ? Bleeding from an injury or your nose that does not stop. ? Unusual colored urine (red or dark brown) or unusual colored stools  (red or black). ? Unusual bruising for unknown reasons. ? A serious fall or if you hit your head (even if there is no bleeding).  Some medicines may interact with Eliquis and might increase your risk of bleeding or clotting while on Eliquis. To help avoid this, consult your healthcare provider or pharmacist prior to using any new prescription or non-prescription medications, including herbals, vitamins, non-steroidal anti-inflammatory drugs (NSAIDs) and supplements.  This website has more information on Eliquis (apixaban): http://www.eliquis.com/eliquis/home

## 2017-05-02 NOTE — Progress Notes (Signed)
   Subjective:  Patient reports pain as mild.    Objective:   VITALS:   Vitals:   05/02/17 0120 05/02/17 0546 05/02/17 0858 05/02/17 1512  BP:  (!) 106/52 (!) 95/42 (!) 86/36  Pulse:  76 70 70  Resp:  18  16  Temp: (!) 97.5 F (36.4 C) 97.8 F (36.6 C) 97.8 F (36.6 C) 97.6 F (36.4 C)  TempSrc: Oral Oral Oral Oral  SpO2:  94% 95% 97%  Weight:      Height:        Neurologically intact Neurovascular intact Sensation intact distally Intact pulses distally Dorsiflexion/Plantar flexion intact Incision: dressing C/D/I and no drainage No cellulitis present Compartment soft   Lab Results  Component Value Date   WBC 9.3 05/02/2017   HGB 7.9 (L) 05/02/2017   HCT 25.2 (L) 05/02/2017   MCV 100.8 (H) 05/02/2017   PLT 155 05/02/2017     Assessment/Plan:  1 Day Post-Op   - Expected postop acute blood loss anemia - will monitor for symptoms - Up with PT/OT - DVT ppx - SCDs, ambulation, eliquis - WBAT operative extremity - Pain control - Discharge planning - she's mainly c/o the c collar  Eduard Roux 05/02/2017, 3:45 PM [phone]

## 2017-05-02 NOTE — Progress Notes (Signed)
Patient began removing Soft collar and oxygen. After reorienting patient multiple times and asking patient to keep collar on patietn began to get physically and verbally aggressive with me. Mitts were applied. Patient has begin to remove those as well. Physician has now been paged.

## 2017-05-02 NOTE — Evaluation (Signed)
Occupational Therapy Evaluation Patient Details Name: Alexis Hamilton MRN: [id number] DOB: 1929/04/26 Today's Date: 05/02/2017    History of Present Illness 83 year old Caucasian female with a past medical history of atrial fibrillation on Eliquis and amiodarone, history of mitral valve repair was transferred from outside hospital for further management of right hip fracture s/p mechanical fall and to get neurosurgical input regarding cervical dens fracture noted on cervical CT. CT scan shows Healing nondisplaced type 2 dens fracture. Incomplete anterior arch of C1 centrally and probable remote nondisplaced fracture on the right. Pt is now s/p IM nail R hip fracture   Clinical Impression   This 83 y/o F presents with the above. Pt presenting with generalized weakness, decreased activity tolerance and pain. Currently requires MaxA +2 for bed mobility and totalA +2 for stand pivot transfers; MaxA +2 for LB ADLs. Pt will benefit from continued acute OT services and recommend SNF level services after discharge to maximize Pt's safety and independence with ADLs and mobility.     Follow Up Recommendations  SNF;Supervision/Assistance - 24 hour    Equipment Recommendations  Other (comment)(defer to next venue )           Precautions / Restrictions Precautions Precautions: Fall Restrictions Weight Bearing Restrictions: Yes RLE Weight Bearing: Weight bearing as tolerated      Mobility Bed Mobility Overal bed mobility: Needs Assistance Bed Mobility: Supine to Sit;Sit to Supine     Supine to sit: Max assist;+2 for physical assistance;+2 for safety/equipment Sit to supine: Max assist;+2 for physical assistance;+2 for safety/equipment   General bed mobility comments: Pt able to progress LLE towards EOB, requires assist for RLE and to bring trunk upright into sitting; assist for trunk and bil LEs when returning to supine   Transfers Overall transfer level: Needs assistance Equipment used:  2 person hand held assist Transfers: Sit to/from [employer] Transfers Sit to Stand: Max assist;+2 physical assistance;+2 safety/equipment Stand pivot transfers: Total assist;+2 physical assistance;+2 safety/equipment       General transfer comment: Assist to rise with MaxA +2, Pt with increased pain and unable to reach full standing posture; Pt requires total assist +2 to pivot EOB<>BSC     Balance Overall balance assessment: Needs assistance Sitting-balance support: Feet supported Sitting balance-Leahy Scale: Fair     Standing balance support: Bilateral upper extremity supported Standing balance-Leahy Scale: Zero                             ADL either performed or assessed with clinical judgement   ADL Overall ADL's : Needs assistance/impaired Eating/Feeding: Set up;Sitting   Grooming: Set up;Sitting   Upper Body Bathing: Minimal assistance;Sitting   Lower Body Bathing: Maximal assistance;Sit to/from stand;+2 for physical assistance;+2 for safety/equipment   Upper Body Dressing : Minimal assistance;Sitting   Lower Body Dressing: Maximal assistance;+2 for physical assistance;+2 for safety/equipment;Sit to/from stand   Toilet Transfer: Total assistance;+2 for safety/equipment;+2 for physical assistance;Stand-pivot;BSC   Toileting- Clothing Manipulation and Hygiene: Sitting/lateral lean;Maximal assistance;+2 for physical assistance;+2 for safety/equipment Toileting - Clothing Manipulation Details (indicate cue type and reason): Pt completes peri-care seated on BSC; anticipate will need increased assist after BM and for clothing management      Functional mobility during ADLs: Total assistance;+2 for physical assistance;+2 for safety/equipment(for stand pivot)                           Pertinent Vitals/Pain  Pain Assessment: Faces Faces Pain Scale: Hurts whole lot Pain Location: RLE  Pain Descriptors / Indicators: Aching;Crying;Discomfort Pain  Intervention(s): Limited activity within patient's tolerance;Repositioned;Ice applied          Extremity/Trunk Assessment Upper Extremity Assessment Upper Extremity Assessment: Generalized weakness;LUE deficits/detail LUE Deficits / Details: decreased AROM in shoulder    Lower Extremity Assessment Lower Extremity Assessment: Defer to PT evaluation   Cervical / Trunk Assessment Cervical / Trunk Assessment: Kyphotic   Communication Communication Communication: HOH   Cognition Arousal/Alertness: Awake/alert Behavior During Therapy: WFL for tasks assessed/performed Overall Cognitive Status: Impaired/Different from baseline                                 General Comments: Pt with difficulty follow commands, though suspect partly due to [facility]; requires repetition and multimodal cues   General Comments                  Home Living Family/patient expects to be discharged to:: Skilled nursing facility                                        Prior Functioning/Environment Level of Independence: Needs assistance  Gait / Transfers Assistance Needed: ambulating with quad cane ADL's / Homemaking Assistance Needed: some assist for ADLs/iADLs   Comments: had just discharged home from recent SNF stay         OT Problem List: Decreased strength;Impaired balance (sitting and/or standing);Pain;Decreased range of motion;Decreased activity tolerance;Decreased knowledge of use of DME or AE      OT Treatment/Interventions: Self-care/ADL training;DME and/or AE instruction;Therapeutic activities;Balance training;Therapeutic exercise;Patient/family education    OT Goals(Current goals can be found in the care plan section) Acute Rehab OT Goals Patient Stated Goal: return home  OT Goal Formulation: With patient Time For Goal Achievement: 05/16/17 Potential to Achieve Goals: Good  OT Frequency: Min 2X/week                             AM-PAC PT "6  Clicks" Daily Activity     Outcome Measure Help from another person eating meals?: None Help from another person taking care of personal grooming?: A Little Help from another person toileting, which includes using toliet, bedpan, or urinal?: A Lot Help from another person bathing (including washing, rinsing, drying)?: A Lot Help from another person to put on and taking off regular upper body clothing?: A Little Help from another person to put on and taking off regular lower body clothing?: A Lot 6 Click Score: 16   End of Session Equipment Utilized During Treatment: Gait belt Nurse Communication: Mobility status  Activity Tolerance: Patient tolerated treatment well;Patient limited by fatigue Patient left: in bed;with call bell/phone within reach;with family/visitor present  OT Visit Diagnosis: Unsteadiness on feet (R26.81);History of falling (Z91.81);Pain Pain - Right/Left: Right Pain - part of body: Hip                Time: 7341-9379 OT Time Calculation (min): 24 min Charges:  OT General Charges $OT Visit: 1 Visit OT Evaluation $OT Eval Moderate Complexity: 1 Mod G-Codes:     Lou Cal, OT Pager [phone] 05/02/2017   Raymondo Band 05/02/2017, 5:32 PM

## 2017-05-02 NOTE — Progress Notes (Signed)
Orthopedic Tech Progress Note Patient Details:  Alexis Hamilton 15-Apr-1929 [id number]  Ortho Devices Type of Ortho Device: Soft collar Ortho Device/Splint Location: neck Ortho Device/Splint Interventions: Ordered, Application   Post Interventions Patient Tolerated: Well Instructions Provided: Care of device   Braulio Bosch 05/02/2017, 7:58 PM

## 2017-05-03 DIAGNOSIS — I482 Chronic atrial fibrillation: Secondary | ICD-10-CM

## 2017-05-03 DIAGNOSIS — R7989 Other specified abnormal findings of blood chemistry: Secondary | ICD-10-CM

## 2017-05-03 DIAGNOSIS — S7221XA Displaced subtrochanteric fracture of right femur, initial encounter for closed fracture: Secondary | ICD-10-CM

## 2017-05-03 DIAGNOSIS — D649 Anemia, unspecified: Secondary | ICD-10-CM

## 2017-05-03 LAB — CBC
HEMATOCRIT: 24.1 % — AB (ref 36.0–46.0)
HEMOGLOBIN: 7.8 g/dL — AB (ref 12.0–15.0)
MCH: 32.5 pg (ref 26.0–34.0)
MCHC: 32.4 g/dL (ref 30.0–36.0)
MCV: 100.4 fL — AB (ref 78.0–100.0)
PLATELETS: 140 10*3/uL — AB (ref 150–400)
RBC: 2.4 MIL/uL — AB (ref 3.87–5.11)
RDW: 16.6 % — ABNORMAL HIGH (ref 11.5–15.5)
WBC: 9.1 10*3/uL (ref 4.0–10.5)

## 2017-05-03 LAB — BASIC METABOLIC PANEL
ANION GAP: 9 (ref 5–15)
BUN: 31 mg/dL — AB (ref 6–20)
CHLORIDE: 104 mmol/L (ref 101–111)
CO2: 21 mmol/L — ABNORMAL LOW (ref 22–32)
Calcium: 7.9 mg/dL — ABNORMAL LOW (ref 8.9–10.3)
Creatinine, Ser: 1.51 mg/dL — ABNORMAL HIGH (ref 0.44–1.00)
GFR calc Af Amer: 35 mL/min — ABNORMAL LOW (ref >60)
GFR, EST NON AFRICAN AMERICAN: 30 mL/min — AB (ref >60)
GLUCOSE: 123 mg/dL — AB (ref 65–99)
POTASSIUM: 4.5 mmol/L (ref 3.5–5.1)
Sodium: 134 mmol/L — ABNORMAL LOW (ref 135–145)

## 2017-05-03 MED ORDER — SENNOSIDES-DOCUSATE SODIUM 8.6-50 MG PO TABS
1.0000 | ORAL_TABLET | Freq: Two times a day (BID) | ORAL | Status: DC
  Administered 2017-05-03 – 2017-05-06 (×4): 1 via ORAL
  Filled 2017-05-03 (×6): qty 1

## 2017-05-03 MED ORDER — POLYETHYLENE GLYCOL 3350 17 G PO PACK
17.0000 g | PACK | Freq: Every day | ORAL | Status: DC
  Administered 2017-05-04 – 2017-05-06 (×2): 17 g via ORAL
  Filled 2017-05-03 (×2): qty 1

## 2017-05-03 NOTE — Progress Notes (Signed)
[facility] PROGRESS NOTE  JOE TANNEY KDT:[id number] DOB: Feb 15, 1929 DOA: 04/30/2017  PCP: [name], MD  Brief History/Interval Summary: 83 year old Caucasian female with a past medical history of atrial fibrillation on Eliquis and amiodarone, history of mitral valve repair at [facility] in 2010 was transferred from hospital in [place] for further management of right hip fracture and to get neurosurgical input regarding cervical dens fracture noted on cervical CT.   Reason for Visit: Right hip fracture  Consultants: Orthopedics.  Neurosurgery.  Procedures: Treatment of right subtrochanteric fracture with intramedullary implant  Antibiotics: None  Subjective/Interval History: Patient feels better, currently denies pain She denies confusion, multiple family member at bedside   ROS: Denies any headaches.  Objective:  Vital Signs  Vitals:   05/02/17 2000 05/03/17 0400 05/03/17 1300 05/03/17 1505  BP: (!) 101/49 (!) 105/44  (!) 96/38  Pulse:  73  70  Resp: 18     Temp: 97.9 F (36.6 C) 97.9 F (36.6 C)  97.7 F (36.5 C)  TempSrc: Oral Oral  Oral  SpO2: 100% 99% 100% 96%  Weight:      Height:        Intake/Output Summary (Last 24 hours) at 05/03/2017 1559 Last data filed at 05/03/2017 1537 Gross per 24 hour  Intake 1223.33 ml  Output -  Net 1223.33 ml   Filed Weights   04/30/17 1300  Weight: 49.9 kg (110 lb)    General appearance: very frail, in soft neck collar, need two person assist to transfer, Awake alert.  No distress. Noted to be in cervical neck collar. Resp: Clear to auscultation bilaterally.  No wheezing rales or rhonchi Cardio: S1-S2 is regular.  No S3-S4.  No rubs murmurs or bruit GI: Abdomen is soft.  Nontender nondistended. Extremities: No bruising noted over the right thigh. Neurologic: No focal deficits. aaox3  Lab Results:  Data Reviewed: I have personally reviewed following labs and imaging  studies  CBC: Recent Labs  Lab 05/01/17 0744 05/02/17 0957 05/03/17 0644  WBC 6.4 9.3 9.1  HGB 9.2* 7.9* 7.8*  HCT 29.6* 25.2* 24.1*  MCV 101.0* 100.8* 100.4*  PLT 168 155 140*    Basic Metabolic Panel: Recent Labs  Lab 04/30/17 1820 05/01/17 0744 05/02/17 0957 05/03/17 0644  NA  --  137 134* 134*  K  --  4.1 4.1 4.5  CL  --  106 102 104  CO2  --  24 21* 21*  GLUCOSE  --  83 155* 123*  BUN  --  25* 24* 31*  CREATININE  --  1.24* 1.31* 1.51*  CALCIUM 7.4* 7.7* 7.7* 7.9*    GFR: Estimated Creatinine Clearance: 21.1 mL/min (A) (by C-G formula based on SCr of 1.51 mg/dL (H)).  Liver Function Tests: Recent Labs  Lab 04/30/17 1820  ALBUMIN 2.8*    Coagulation Profile: Recent Labs  Lab 04/30/17 1328  INR 1.66     Recent Results (from the past 240 hour(s))  MRSA PCR Screening     Status: Abnormal   Collection Time: 04/30/17  1:40 PM  Result Value Ref Range Status   MRSA by PCR POSITIVE (A) NEGATIVE Final    Comment:        The GeneXpert MRSA Assay (FDA approved for NASAL specimens only), is one component of a comprehensive MRSA colonization surveillance program. It is not intended to diagnose MRSA infection nor to guide or monitor treatment for MRSA infections. RESULT CALLED TO, READ BACK BY AND VERIFIED  WITHMinus Liberty RN [id number] 04/30/17 A BROWNING       Radiology Studies: Ct Cervical Spine Wo Contrast  Result Date: 05/02/2017 CLINICAL DATA:  Evaluate dens fracture.  Patient fell 1 month ago. EXAM: CT CERVICAL SPINE WITHOUT CONTRAST TECHNIQUE: Multidetector CT imaging of the cervical spine was performed without intravenous contrast. Multiplanar CT image reconstructions were also generated. COMPARISON:  None. FINDINGS: Alignment: Markedly exaggerated thoracic kyphosis and somewhat exaggerated cervical lordosis. The overall alignment is maintained. Skull base and vertebrae: There is a healing type 2 dens fracture which is nondisplaced. There are  definitely areas of bony ingrowth both medially and laterally but the fracture is still apparent. There is an incomplete anterior arch of C1 centrally. There is also a probable remote ununited fracture involving the anterior arch of C1 on the right side. Soft tissues and spinal canal: No abnormal prevertebral soft tissue swelling. No paraspinal process. The spinal canal is quite generous. No intraspinal hematoma. Disc levels: No significant cervical disc protrusions, spinal or foraminal stenosis. Upper chest: Emphysematous changes but no worrisome pulmonary lesions. Other: No obvious neck masses. IMPRESSION: 1. Healing nondisplaced type 2 dens fracture. 2. Incomplete anterior arch of C1 centrally and probable remote nondisplaced fracture on the right. 3. Very generous spinal canal.  No spinal stenosis. Electronically Signed   By: Marijo Sanes M.D.   On: 05/02/2017 15:20   Dg C-arm 1-60 Min  Result Date: 05/01/2017 CLINICAL DATA:  Intramedullary (Im) Nail intertrochantric Right femur. EXAM: RIGHT FEMUR 2 VIEWS; DG C-ARM 61-120 MIN COMPARISON:  None. FINDINGS: Four intraoperative fluoroscopic spot images of the right hip show fixation of the right femoral neck fracture with intramedullary rod and dynamic hip screws. Osseous alignment is anatomic. Hardware appears intact and appropriately positioned. Fluoroscopy was provided for 2 minutes and 2 seconds. IMPRESSION: Internal fixation hardware traversing the right femoral neck fracture site appears intact and appropriately position. No evidence of surgical complicating feature. Electronically Signed   By: Franki Cabot M.D.   On: 05/01/2017 19:59   Dg Femur, Min 2 Views Right  Result Date: 05/01/2017 CLINICAL DATA:  Intramedullary (Im) Nail intertrochantric Right femur. EXAM: RIGHT FEMUR 2 VIEWS; DG C-ARM 61-120 MIN COMPARISON:  None. FINDINGS: Four intraoperative fluoroscopic spot images of the right hip show fixation of the right femoral neck fracture with  intramedullary rod and dynamic hip screws. Osseous alignment is anatomic. Hardware appears intact and appropriately positioned. Fluoroscopy was provided for 2 minutes and 2 seconds. IMPRESSION: Internal fixation hardware traversing the right femoral neck fracture site appears intact and appropriately position. No evidence of surgical complicating feature. Electronically Signed   By: Franki Cabot M.D.   On: 05/01/2017 19:59     Medications:  Scheduled: . amiodarone  200 mg Oral Daily  . apixaban  2.5 mg Oral BID  . Chlorhexidine Gluconate Cloth  6 each Topical Q0600  . docusate sodium  100 mg Oral BID  . feeding supplement (ENSURE ENLIVE)  237 mL Oral BID BM  . mupirocin ointment  1 application Nasal BID   Continuous: . sodium chloride 50 mL/hr at 05/02/17 1708  . lactated ringers 10 mL/hr at 05/01/17 1520  . methocarbamol (ROBAXIN)  IV     OVF:IEPPIRJJOACZY **OR** acetaminophen, alum & mag hydroxide-simeth, haloperidol lactate, HYDROcodone-acetaminophen, menthol-cetylpyridinium **OR** phenol, methocarbamol **OR** methocarbamol (ROBAXIN)  IV, metoCLOPramide **OR** metoCLOPramide (REGLAN) injection, morphine injection, ondansetron **OR** ondansetron (ZOFRAN) IV, oxyCODONE, polyethylene glycol  Assessment/Plan:  Active Problems:   Atrial fibrillation, chronic (HCC)  History of repair of mitral valve   Closed subtrochanteric fracture of hip, right, initial encounter [facility])    Right intertrochanteric fracture Patient underwent surgery on 12/26. S/p Treatment of subtrochanteric fracture with intramedullary implant. CPT 27245   Pain control.  PT and OT evaluation.  She is on chronic anticoagulation for afib Low vitamin D noted.  She will need vitamin D replacement at discharge.  Chronic atrial fibrillation Patient is on Eliquis at home.  Also noted to be on amiodarone.  Eliquis was held for surgery.  Patient was placed on IV heparin.  Now back on Eliquis.    Age-indeterminate fracture  of dens This was seen on CT scan of the cervical spine from outside hospital.  Patient denies any neck pain currently.  Apparently also fell 2 months ago and had fractured her left elbow.  She required surgery for that.  She was in rehabilitation and was discharged from rehab last week.  She was sent home with home health.   neurosurgery Dr. Ronnald Ramp  Recommends continuing the cervical neck collar for now. Outpatient follow up  Macrocytic anemia/acute blood loss anemia Baseline hemoglobin not known.  Hemoglobin was around 10 at the outside facility.  Hemoglobin has dropped to 7.9.  Some of this could be due to operative blood loss.  Continue to monitor for now.  Transfuse if it drops below 7.  Anemia panel reviewed.  Ferritin 172.  B12 321.  Folate 18.7.    Hypotension Low blood pressures noted.  Patient is asymptomatic.  She has not noted on any other blood pressure lowering agents apart from amiodarone.  Could be baseline for her.  Continue to monitor for now.  Elevated creatinine Baseline renal function is not known.  She could have chronic kidney disease.  Unable to determine at this time.  Monitor urine output.  Creatinine noted to be slightly higher today.  Monitor urine output.  Check ua, Repeat labs tomorrow.  DVT Prophylaxis: On Eliquis Code Status: Full code Family Communication: Discussed with patient and her husband/daughter at bedside Disposition Plan: SNF when bed available     LOS: 3 days   Florencia Reasons MD PhD  [facility] Pager [phone] 05/03/2017, 3:59 PM  If 7PM-7AM, please contact night-coverage at [email or web address], password [facility]

## 2017-05-03 NOTE — Progress Notes (Signed)
Physical Therapy Treatment Patient Details Name: Alexis Hamilton MRN: [id number] DOB: [birth year] Today's Date: 05/03/2017    History of Present Illness 83 year old Caucasian female with a past medical history of atrial fibrillation on Eliquis and amiodarone, history of mitral valve repair was transferred from outside hospital for further management of right hip fracture s/p mechanical fall and to get neurosurgical input regarding cervical dens fracture noted on cervical CT. CT scan shows Healing nondisplaced type 2 dens fracture. Incomplete anterior arch of C1 centrally and probable remote nondisplaced fracture on the right. Pt is now s/p IM nail R hip fracture 12/26    PT Comments     Patient progressing with therapy. Session focused on bed mobility and improving standing tolerance. Pt demonstrating improved participation this evening and is now mod A x2 to transfer. Will progress functional mobility as tolerated.     Follow Up Recommendations  SNF;Supervision/Assistance - 24 hour     Equipment Recommendations  Rolling walker with 5" wheels;3in1 (PT)    Recommendations for Other Services       Precautions / Restrictions Precautions Precautions: Fall Precaution Comments: hx of falls, post op pain/weakness Restrictions Weight Bearing Restrictions: Yes RLE Weight Bearing: Weight bearing as tolerated    Mobility  Bed Mobility Overal bed mobility: Needs Assistance Bed Mobility: Supine to Sit;Sit to Supine     Supine to sit: +2 for physical assistance;Max assist Sit to supine: Max assist;+2 for physical assistance   General bed mobility comments: improved effort from patient this evening, able to participate with UE and move RLE more this evening.   Transfers Overall transfer level: Needs assistance Equipment used: 2 person hand held assist Transfers: Sit to/from [employer] Sit to Stand: +2 physical assistance;+2 safety/equipment;Mod assist Stand pivot  transfers: +2 physical assistance;+2 safety/equipment;Max assist       General transfer comment: mod A x2 to power up tonight. tolerated standing for 5-8 minutes. cues for hand placement and techinque with walker. patient with heavy left sway, corrected with cues able to maintain but favoring L leg due to pain  Ambulation/Gait             General Gait Details: unable   Stairs            Wheelchair Mobility    Modified Rankin (Stroke Patients Only)       Balance Overall balance assessment: Needs assistance Sitting-balance support: Feet supported Sitting balance-Leahy Scale: Fair     Standing balance support: Bilateral upper extremity supported Standing balance-Leahy Scale: Zero                              Cognition Arousal/Alertness: Awake/alert Behavior During Therapy: WFL for tasks assessed/performed Overall Cognitive Status: Impaired/Different from baseline                                 General Comments: improved command following this evening.      Exercises Total Joint Exercises Ankle Circles/Pumps: AROM;Both;20 reps Long Arc Quad: AROM;Both;10 reps    General Comments        Pertinent Vitals/Pain Pain Assessment: Faces Faces Pain Scale: Hurts whole lot Pain Location: RLE  Pain Descriptors / Indicators: Aching;Crying;Discomfort Pain Intervention(s): Limited activity within patient's tolerance;Monitored during session;Repositioned    Home Living  Prior Function            PT Goals (current goals can now be found in the care plan section) Acute Rehab PT Goals Patient Stated Goal: return home  PT Goal Formulation: With patient Time For Goal Achievement: 05/09/17 Potential to Achieve Goals: Poor Progress towards PT goals: Progressing toward goals    Frequency    Min 3X/week      PT Plan Current plan remains appropriate    Co-evaluation              AM-PAC PT "6  Clicks" Daily Activity  Outcome Measure  Difficulty turning over in bed (including adjusting bedclothes, sheets and blankets)?: Unable Difficulty moving from lying on back to sitting on the side of the bed? : Unable Difficulty sitting down on and standing up from a chair with arms (e.g., wheelchair, bedside commode, etc,.)?: Unable Help needed moving to and from a bed to chair (including a wheelchair)?: Total Help needed walking in hospital room?: Total Help needed climbing 3-5 steps with a railing? : Total 6 Click Score: 6    End of Session Equipment Utilized During Treatment: Gait belt Activity Tolerance: Patient limited by pain;Patient limited by lethargy Patient left: in bed;with call bell/phone within reach;with family/visitor present Nurse Communication: Mobility status PT Visit Diagnosis: Unsteadiness on feet (R26.81);Other abnormalities of gait and mobility (R26.89);Repeated falls (R29.6);Muscle weakness (generalized) (M62.81);History of falling (Z91.81);Pain Pain - Right/Left: Right Pain - part of body: Hip     Time: 1735-1758 PT Time Calculation (min) (ACUTE ONLY): 23 min  Charges:  $Therapeutic Activity: 8-22 mins                    G Codes:       Reinaldo Berber, PT, DPT Acute Rehab Services Pager: [phone]     Reinaldo Berber 05/03/2017, 6:10 PM

## 2017-05-03 NOTE — Progress Notes (Signed)
Patient ID: Alexis Hamilton, female   DOB: 1929/05/07, 83 y.o.   MRN: [id number] CT shows partially healed C1 and C2 fxs, non -displaced and likely subacute or chronic. Soft collar ok given her intolerance of the semi-rigid orthosis and her partially healed status. Mobilize in soft collar, and f/u with me in office 2 wks after discharge for serial xrays to assess healing

## 2017-05-04 LAB — COMPREHENSIVE METABOLIC PANEL
AST: 27 U/L (ref 15–41)
Albumin: 2.6 g/dL — ABNORMAL LOW (ref 3.5–5.0)
Alkaline Phosphatase: 127 U/L — ABNORMAL HIGH (ref 38–126)
Anion gap: 5 (ref 5–15)
BILIRUBIN TOTAL: 0.8 mg/dL (ref 0.3–1.2)
BUN: 28 mg/dL — AB (ref 6–20)
CO2: 22 mmol/L (ref 22–32)
CREATININE: 1.14 mg/dL — AB (ref 0.44–1.00)
Calcium: 7.9 mg/dL — ABNORMAL LOW (ref 8.9–10.3)
Chloride: 107 mmol/L (ref 101–111)
GFR calc Af Amer: 49 mL/min — ABNORMAL LOW (ref >60)
GFR, EST NON AFRICAN AMERICAN: 42 mL/min — AB (ref >60)
Glucose, Bld: 111 mg/dL — ABNORMAL HIGH (ref 65–99)
Potassium: 4.1 mmol/L (ref 3.5–5.1)
Sodium: 134 mmol/L — ABNORMAL LOW (ref 135–145)
TOTAL PROTEIN: 5.8 g/dL — AB (ref 6.5–8.1)

## 2017-05-04 LAB — URINALYSIS, ROUTINE W REFLEX MICROSCOPIC
Bilirubin Urine: NEGATIVE
Glucose, UA: NEGATIVE mg/dL
HGB URINE DIPSTICK: NEGATIVE
Ketones, ur: NEGATIVE mg/dL
Nitrite: NEGATIVE
PH: 5 (ref 5.0–8.0)
Protein, ur: NEGATIVE mg/dL
SPECIFIC GRAVITY, URINE: 1.019 (ref 1.005–1.030)

## 2017-05-04 LAB — CBC
HEMATOCRIT: 26 % — AB (ref 36.0–46.0)
Hemoglobin: 8.1 g/dL — ABNORMAL LOW (ref 12.0–15.0)
MCH: 30.8 pg (ref 26.0–34.0)
MCHC: 31.2 g/dL (ref 30.0–36.0)
MCV: 98.9 fL (ref 78.0–100.0)
Platelets: 171 10*3/uL (ref 150–400)
RBC: 2.63 MIL/uL — AB (ref 3.87–5.11)
RDW: 16.5 % — AB (ref 11.5–15.5)
WBC: 10.3 10*3/uL (ref 4.0–10.5)

## 2017-05-04 LAB — TSH: TSH: 3.874 u[IU]/mL (ref 0.350–4.500)

## 2017-05-04 LAB — CORTISOL: CORTISOL PLASMA: 24.5 ug/dL

## 2017-05-04 MED ORDER — BISACODYL 10 MG RE SUPP
10.0000 mg | Freq: Once | RECTAL | Status: AC
  Administered 2017-05-04: 10 mg via RECTAL
  Filled 2017-05-04: qty 1

## 2017-05-04 NOTE — Progress Notes (Signed)
Physical Therapy Treatment Patient Details Name: Alexis Hamilton MRN: [id number] DOB: April 07, 1929 Today's Date: 05/04/2017    History of Present Illness 83 year old Caucasian female with a past medical history of atrial fibrillation on Eliquis and amiodarone, history of mitral valve repair was transferred from outside hospital for further management of right hip fracture s/p mechanical fall and to get neurosurgical input regarding cervical dens fracture noted on cervical CT. CT scan shows Healing nondisplaced type 2 dens fracture. Incomplete anterior arch of C1 centrally and probable remote nondisplaced fracture on the right. Pt is now s/p IM nail R hip fracture 12/26    PT Comments    Patient more lethargic this session and pain not as controlled. Unable to progress OOB mobility this evening but worked on transfers and encouraged patient to spend some time in chair. Max A x2 for stand pivot this evening. Will continue to improve as tolerated.    Follow Up Recommendations  SNF;Supervision/Assistance - 24 hour     Equipment Recommendations  Rolling walker with 5" wheels;3in1 (PT)    Recommendations for Other Services       Precautions / Restrictions Precautions Precautions: Fall Precaution Comments: hx of falls, post op pain/weakness Restrictions Weight Bearing Restrictions: Yes RLE Weight Bearing: Weight bearing as tolerated    Mobility  Bed Mobility Overal bed mobility: Needs Assistance Bed Mobility: Supine to Sit;Sit to Supine     Supine to sit: +2 for physical assistance;Max assist Sit to supine: Max assist;+2 for physical assistance   General bed mobility comments: improved effort from patient this evening, able to participate with UE and move RLE more this evening.   Transfers Overall transfer level: Needs assistance Equipment used: 2 person hand held assist Transfers: Sit to/from [facility] Sit to Stand: +2 physical assistance;+2  safety/equipment;Mod assist Stand pivot transfers: +2 physical assistance;+2 safety/equipment;Max assist       General transfer comment: patient more lethargic tonight, now Max A x2 to power up tonight. . cues for hand placement and techinque with walker. patient with heavy left sway, corrected with cues able to maintain but favoring L leg due to pain  Ambulation/Gait                 Stairs            Wheelchair Mobility    Modified Rankin (Stroke Patients Only)       Balance Overall balance assessment: Needs assistance Sitting-balance support: Feet supported Sitting balance-Leahy Scale: Fair     Standing balance support: Bilateral upper extremity supported Standing balance-Leahy Scale: Zero                              Cognition Arousal/Alertness: Awake/alert Behavior During Therapy: WFL for tasks assessed/performed Overall Cognitive Status: Impaired/Different from baseline                                        Exercises Total Joint Exercises Ankle Circles/Pumps: AROM;Both;20 reps Long Arc Quad: AROM;Both;10 reps    General Comments        Pertinent Vitals/Pain Pain Assessment: Faces Faces Pain Scale: Hurts whole lot Pain Location: RLE  Pain Descriptors / Indicators: Aching;Crying;Discomfort Pain Intervention(s): Limited activity within patient's tolerance;Monitored during session;Repositioned    Home Living  Prior Function            PT Goals (current goals can now be found in the care plan section) Acute Rehab PT Goals Patient Stated Goal: return home  PT Goal Formulation: With patient Time For Goal Achievement: 05/09/17 Potential to Achieve Goals: Poor Progress towards PT goals: Progressing toward goals    Frequency    Min 3X/week      PT Plan Current plan remains appropriate    Co-evaluation              AM-PAC PT "6 Clicks" Daily Activity  Outcome Measure   Difficulty turning over in bed (including adjusting bedclothes, sheets and blankets)?: Unable Difficulty moving from lying on back to sitting on the side of the bed? : Unable Difficulty sitting down on and standing up from a chair with arms (e.g., wheelchair, bedside commode, etc,.)?: Unable Help needed moving to and from a bed to chair (including a wheelchair)?: Total Help needed walking in hospital room?: Total Help needed climbing 3-5 steps with a railing? : Total 6 Click Score: 6    End of Session Equipment Utilized During Treatment: Gait belt Activity Tolerance: Patient limited by pain;Patient limited by lethargy Patient left: with call bell/phone within reach;with family/visitor present;in chair Nurse Communication: Mobility status PT Visit Diagnosis: Unsteadiness on feet (R26.81);Other abnormalities of gait and mobility (R26.89);Repeated falls (R29.6);Muscle weakness (generalized) (M62.81);History of falling (Z91.81);Pain Pain - Right/Left: Right Pain - part of body: Hip     Time: 9449-6759 PT Time Calculation (min) (ACUTE ONLY): 17 min  Charges:  $Therapeutic Activity: 8-22 mins                    G Codes:       Reinaldo Berber, PT, DPT Acute Rehab Services Pager: [phone]     Reinaldo Berber 05/04/2017, 6:37 PM

## 2017-05-04 NOTE — Progress Notes (Signed)
[facility] PROGRESS NOTE  Alexis Hamilton [id number] DOB: March 29, 1929 DOA: 04/30/2017  PCP: [name], MD  Brief History/Interval Summary: 83 year old Caucasian female with a past medical history of atrial fibrillation on Eliquis and amiodarone, history of mitral valve repair at [facility] in 2010 was transferred from hospital in [place] for further management of right hip fracture and to get neurosurgical input regarding cervical dens fracture noted on cervical CT.   Reason for Visit: Right hip fracture  Consultants: Orthopedics.  Neurosurgery.  Procedures: Treatment of right subtrochanteric fracture with intramedullary implant  Antibiotics: None  Subjective/Interval History: Patient feels better, currently denies pain She denies confusion, husband at bedside  No bm documented since in the hospital  ROS: Denies any headaches.  Objective:  Vital Signs  Vitals:   05/03/17 1300 05/03/17 1505 05/03/17 2009 05/04/17 0355  BP:  (!) 96/38 (!) 100/50 (!) 112/52  Pulse:  70 66 78  Resp:   12 16  Temp:  97.7 F (36.5 C) 97.9 F (36.6 C) 98.3 F (36.8 C)  TempSrc:  Oral Oral Oral  SpO2: 100% 96% 95% 92%  Weight:      Height:        Intake/Output Summary (Last 24 hours) at 05/04/2017 2353 Last data filed at 05/03/2017 1537 Gross per 24 hour  Intake 1223.33 ml  Output -  Net 1223.33 ml   Filed Weights   04/30/17 1300  Weight: 49.9 kg (110 lb)    General appearance: very frail, in soft neck collar, need two person assist to transfer, Awake alert.  No distress. Noted to be in cervical neck collar. Resp: Clear to auscultation bilaterally.  No wheezing rales or rhonchi Cardio: S1-S2 is regular.  No S3-S4.  No rubs murmurs or bruit GI: Abdomen is soft.  Nontender nondistended. Extremities: No bruising noted over the right thigh. Neurologic: No focal deficits. aaox3  Lab Results:  Data Reviewed: I have personally reviewed following labs  and imaging studies  CBC: Recent Labs  Lab 05/01/17 0744 05/02/17 0957 05/03/17 0644  WBC 6.4 9.3 9.1  HGB 9.2* 7.9* 7.8*  HCT 29.6* 25.2* 24.1*  MCV 101.0* 100.8* 100.4*  PLT 168 155 140*    Basic Metabolic Panel: Recent Labs  Lab 04/30/17 1820 05/01/17 0744 05/02/17 0957 05/03/17 0644  NA  --  137 134* 134*  K  --  4.1 4.1 4.5  CL  --  106 102 104  CO2  --  24 21* 21*  GLUCOSE  --  83 155* 123*  BUN  --  25* 24* 31*  CREATININE  --  1.24* 1.31* 1.51*  CALCIUM 7.4* 7.7* 7.7* 7.9*    GFR: Estimated Creatinine Clearance: 21.1 mL/min (A) (by C-G formula based on SCr of 1.51 mg/dL (H)).  Liver Function Tests: Recent Labs  Lab 04/30/17 1820  ALBUMIN 2.8*    Coagulation Profile: Recent Labs  Lab 04/30/17 1328  INR 1.66     Recent Results (from the past 240 hour(s))  MRSA PCR Screening     Status: Abnormal   Collection Time: 04/30/17  1:40 PM  Result Value Ref Range Status   MRSA by PCR POSITIVE (A) NEGATIVE Final    Comment:        The GeneXpert MRSA Assay (FDA approved for NASAL specimens only), is one component of a comprehensive MRSA colonization surveillance program. It is not intended to diagnose MRSA infection nor to guide or monitor treatment for MRSA infections. RESULT CALLED TO,  READ BACK BY AND VERIFIED WITHMinus Liberty RN [id number] 04/30/17 A BROWNING       Radiology Studies: Ct Cervical Spine Wo Contrast  Result Date: 05/02/2017 CLINICAL DATA:  Evaluate dens fracture.  Patient fell 1 month ago. EXAM: CT CERVICAL SPINE WITHOUT CONTRAST TECHNIQUE: Multidetector CT imaging of the cervical spine was performed without intravenous contrast. Multiplanar CT image reconstructions were also generated. COMPARISON:  None. FINDINGS: Alignment: Markedly exaggerated thoracic kyphosis and somewhat exaggerated cervical lordosis. The overall alignment is maintained. Skull base and vertebrae: There is a healing type 2 dens fracture which is nondisplaced. There  are definitely areas of bony ingrowth both medially and laterally but the fracture is still apparent. There is an incomplete anterior arch of C1 centrally. There is also a probable remote ununited fracture involving the anterior arch of C1 on the right side. Soft tissues and spinal canal: No abnormal prevertebral soft tissue swelling. No paraspinal process. The spinal canal is quite generous. No intraspinal hematoma. Disc levels: No significant cervical disc protrusions, spinal or foraminal stenosis. Upper chest: Emphysematous changes but no worrisome pulmonary lesions. Other: No obvious neck masses. IMPRESSION: 1. Healing nondisplaced type 2 dens fracture. 2. Incomplete anterior arch of C1 centrally and probable remote nondisplaced fracture on the right. 3. Very generous spinal canal.  No spinal stenosis. Electronically Signed   By: Marijo Sanes M.D.   On: 05/02/2017 15:20     Medications:  Scheduled: . amiodarone  200 mg Oral Daily  . apixaban  2.5 mg Oral BID  . Chlorhexidine Gluconate Cloth  6 each Topical Q0600  . feeding supplement (ENSURE ENLIVE)  237 mL Oral BID BM  . mupirocin ointment  1 application Nasal BID  . polyethylene glycol  17 g Oral Daily  . senna-docusate  1 tablet Oral BID   Continuous: . sodium chloride 50 mL/hr at 05/02/17 1708  . lactated ringers 10 mL/hr at 05/01/17 1520  . methocarbamol (ROBAXIN)  IV     TIR:[id number] **OR** acetaminophen, alum & mag hydroxide-simeth, haloperidol lactate, HYDROcodone-acetaminophen, menthol-cetylpyridinium **OR** phenol, methocarbamol **OR** methocarbamol (ROBAXIN)  IV, metoCLOPramide **OR** metoCLOPramide (REGLAN) injection, morphine injection, ondansetron **OR** ondansetron (ZOFRAN) IV, oxyCODONE, polyethylene glycol  Assessment/Plan:  Active Problems:   Atrial fibrillation, chronic (HCC)   History of repair of mitral valve   Closed subtrochanteric fracture of hip, right, initial encounter [name])    Right  intertrochanteric fracture Patient underwent surgery on 12/26. S/p Treatment of subtrochanteric fracture with intramedullary implant. CPT 27245   Pain control.  PT and OT evaluation.  She is on chronic anticoagulation for afib Low vitamin D noted.  She will need vitamin D replacement at discharge.  Chronic atrial fibrillation Patient is on Eliquis at home.  Also noted to be on amiodarone.  Eliquis was held for surgery.  Patient was placed on IV heparin.  Now back on Eliquis.    Age-indeterminate fracture of dens This was seen on CT scan of the cervical spine from outside hospital.  Patient denies any neck pain currently.  Apparently also fell 2 months ago and had fractured her left elbow.  She required surgery for that.  She was in rehabilitation and was discharged from rehab last week.  She was sent home with home health.   neurosurgery Dr. Ronnald Ramp  Recommends continuing the cervical neck collar for now. Outpatient follow up  Macrocytic anemia/acute blood loss anemia Baseline hemoglobin not known.  Hemoglobin was around 10 at the outside facility.  Hemoglobin has dropped to 7.9.  Some of this could be due to operative blood loss.  Continue to monitor for now.  Transfuse if it drops below 7.  Anemia panel reviewed.  Ferritin 172.  B12 321.  Folate 18.7.    Hypotension Low blood pressures noted.  Patient is asymptomatic.  She has not noted on any other blood pressure lowering agents apart from amiodarone.  Could be baseline for her.  Continue to monitor for now. Tsh, am cortisol unremarkable.  Elevated creatinine Baseline renal function is not known.  She could have chronic kidney disease.  Unable to determine at this time.  Monitor urine output.   Monitor urine output.  Check ua/urine culture, she denies urinary symptom, cr improving, Repeat labs tomorrow.  DVT Prophylaxis: On Eliquis Code Status: Full code Family Communication: Discussed with patient and her husband at bedside Disposition  Plan: SNF when bed available     LOS: 4 days   Florencia Reasons MD PhD  [facility] Pager [phone] 05/04/2017, 8:21 AM  If 7PM-7AM, please contact night-coverage at [email or web address], password [facility]

## 2017-05-05 LAB — BASIC METABOLIC PANEL
Anion gap: 7 (ref 5–15)
BUN: 26 mg/dL — ABNORMAL HIGH (ref 6–20)
CHLORIDE: 105 mmol/L (ref 101–111)
CO2: 21 mmol/L — ABNORMAL LOW (ref 22–32)
CREATININE: 1.03 mg/dL — AB (ref 0.44–1.00)
Calcium: 7.5 mg/dL — ABNORMAL LOW (ref 8.9–10.3)
GFR, EST AFRICAN AMERICAN: 55 mL/min — AB (ref >60)
GFR, EST NON AFRICAN AMERICAN: 48 mL/min — AB (ref >60)
Glucose, Bld: 109 mg/dL — ABNORMAL HIGH (ref 65–99)
Potassium: 4.3 mmol/L (ref 3.5–5.1)
SODIUM: 133 mmol/L — AB (ref 135–145)

## 2017-05-05 LAB — URINE CULTURE

## 2017-05-05 LAB — CBC
HCT: 24 % — ABNORMAL LOW (ref 36.0–46.0)
HEMOGLOBIN: 7.6 g/dL — AB (ref 12.0–15.0)
MCH: 31.8 pg (ref 26.0–34.0)
MCHC: 31.7 g/dL (ref 30.0–36.0)
MCV: 100.4 fL — ABNORMAL HIGH (ref 78.0–100.0)
Platelets: 157 10*3/uL (ref 150–400)
RBC: 2.39 MIL/uL — AB (ref 3.87–5.11)
RDW: 17.1 % — ABNORMAL HIGH (ref 11.5–15.5)
WBC: 10.7 10*3/uL — ABNORMAL HIGH (ref 4.0–10.5)

## 2017-05-05 MED ORDER — VITAMIN D 1000 UNITS PO TABS
1000.0000 [IU] | ORAL_TABLET | Freq: Every day | ORAL | Status: DC
  Administered 2017-05-06 – 2017-05-07 (×2): 1000 [IU] via ORAL
  Filled 2017-05-05 (×2): qty 1

## 2017-05-05 NOTE — Progress Notes (Addendum)
[facility] PROGRESS NOTE  Alexis Hamilton EHM:[id number] DOB: September 02, 1928 DOA: 04/30/2017  PCP: [name], MD  Brief History/Interval Summary: 83 year old Caucasian female with a past medical history of atrial fibrillation on Eliquis and amiodarone, history of mitral valve repair at [facility] in 2010 was transferred from hospital in [place] for further management of right hip fracture and to get neurosurgical input regarding cervical dens fracture noted on cervical CT.   Reason for Visit: Right hip fracture  Consultants: Orthopedics.  Neurosurgery.  Procedures: Treatment of right subtrochanteric fracture with intramedullary implant  Antibiotics: None  Subjective/Interval History: -Patient feels better, currently denies pain -She denies confusion, husband at bedside  -No bm documented since in the hospital  ROS: Denies any headaches.  Objective:  Vital Signs  Vitals:   05/04/17 1500 05/04/17 1927 05/05/17 0655 05/05/17 0657  BP: (!) 104/55 (!) 116/52 (!) 87/48 (!) 97/48  Pulse: 70 78 66   Resp: 16     Temp: 97.8 F (36.6 C) 98.1 F (36.7 C) 97.8 F (36.6 C)   TempSrc: Oral Oral Oral   SpO2: 94% 97% 100%   Weight:      Height:        Intake/Output Summary (Last 24 hours) at 05/05/2017 1455 Last data filed at 05/05/2017 0700 Gross per 24 hour  Intake 1656.67 ml  Output -  Net 1656.67 ml   Filed Weights   04/30/17 1300  Weight: 49.9 kg (110 lb)    General appearance: very frail, in soft neck collar, need two person assist to transfer, Awake alert.  No distress. Noted to be in cervical neck collar. Resp: Clear to auscultation bilaterally.  No wheezing rales or rhonchi Cardio: S1-S2 is regular.  No S3-S4.  No rubs murmurs or bruit GI: Abdomen is soft.  Nontender nondistended. Extremities: No bruising noted over the right thigh. Neurologic: No focal deficits. aaox3  Lab Results:  Data Reviewed: I have personally reviewed following  labs and imaging studies  CBC: Recent Labs  Lab 05/01/17 0744 05/02/17 0957 05/03/17 0644 05/04/17 0729 05/05/17 0323  WBC 6.4 9.3 9.1 10.3 10.7*  HGB 9.2* 7.9* 7.8* 8.1* 7.6*  HCT 29.6* 25.2* 24.1* 26.0* 24.0*  MCV 101.0* 100.8* 100.4* 98.9 100.4*  PLT 168 155 140* 171 366    Basic Metabolic Panel: Recent Labs  Lab 05/01/17 0744 05/02/17 0957 05/03/17 0644 05/04/17 0729 05/05/17 0323  NA 137 134* 134* 134* 133*  K 4.1 4.1 4.5 4.1 4.3  CL 106 102 104 107 105  CO2 24 21* 21* 22 21*  GLUCOSE 83 155* 123* 111* 109*  BUN 25* 24* 31* 28* 26*  CREATININE 1.24* 1.31* 1.51* 1.14* 1.03*  CALCIUM 7.7* 7.7* 7.9* 7.9* 7.5*    GFR: Estimated Creatinine Clearance: 30.9 mL/min (A) (by C-G formula based on SCr of 1.03 mg/dL (H)).  Liver Function Tests: Recent Labs  Lab 04/30/17 1820 05/04/17 0729  AST  --  27  ALT  --  <5*  ALKPHOS  --  127*  BILITOT  --  0.8  PROT  --  5.8*  ALBUMIN 2.8* 2.6*    Coagulation Profile: Recent Labs  Lab 04/30/17 1328  INR 1.66     Recent Results (from the past 240 hour(s))  MRSA PCR Screening     Status: Abnormal   Collection Time: 04/30/17  1:40 PM  Result Value Ref Range Status   MRSA by PCR POSITIVE (A) NEGATIVE Final    Comment:  The GeneXpert MRSA Assay (FDA approved for NASAL specimens only), is one component of a comprehensive MRSA colonization surveillance program. It is not intended to diagnose MRSA infection nor to guide or monitor treatment for MRSA infections. RESULT CALLED TO, READ BACK BY AND VERIFIED WITHMinus Liberty RN [id number] 04/30/17 A BROWNING       Radiology Studies: No results found.   Medications:  Scheduled: . amiodarone  200 mg Oral Daily  . apixaban  2.5 mg Oral BID  . Chlorhexidine Gluconate Cloth  6 each Topical Q0600  . cholecalciferol  1,000 Units Oral Daily  . feeding supplement (ENSURE ENLIVE)  237 mL Oral BID BM  . mupirocin ointment  1 application Nasal BID  . polyethylene  glycol  17 g Oral Daily  . senna-docusate  1 tablet Oral BID   Continuous: . methocarbamol (ROBAXIN)  IV     LPF:[id number] **OR** acetaminophen, alum & mag hydroxide-simeth, haloperidol lactate, HYDROcodone-acetaminophen, menthol-cetylpyridinium **OR** phenol, methocarbamol **OR** methocarbamol (ROBAXIN)  IV, metoCLOPramide **OR** metoCLOPramide (REGLAN) injection, morphine injection, ondansetron **OR** ondansetron (ZOFRAN) IV, oxyCODONE, polyethylene glycol  Assessment/Plan:  Active Problems:   Atrial fibrillation, chronic (HCC)   History of repair of mitral valve   Closed subtrochanteric fracture of hip, right, initial encounter [facility])    Right intertrochanteric fracture Patient underwent surgery on 12/26. S/p Treatment of subtrochanteric fracture with intramedullary implant. CPT 27245   Pain control.  PT and OT evaluation.  She is on chronic anticoagulation for afib Low vitamin D noted.  She will need vitamin D replacement at discharge.  Chronic atrial fibrillation Patient is on Eliquis at home.  Also noted to be on amiodarone.  Eliquis was held for surgery.  Patient was placed on IV heparin.  Now back on Eliquis.    Age-indeterminate fracture of dens This was seen on CT scan of the cervical spine from outside hospital.  Patient denies any neck pain currently.  Apparently also fell 2 months ago and had fractured her left elbow.  She required surgery for that.  She was in rehabilitation and was discharged from rehab last week.  She was sent home with home health.   neurosurgery Dr. Ronnald Ramp  Recommends continuing the cervical neck collar for now. Outpatient follow up  Macrocytic anemia/acute blood loss anemia -Baseline hemoglobin not known. It was normal in 2012-2013. No labs since then. - Hemoglobin was around 10 at the outside facility.  Hemoglobin has dropped, Some of this could be due to operative blood loss.  Continue to monitor for now.  Transfuse if it drops below 7.  -  Anemia panel reviewed.  Ferritin 172.  B12 321.  Folate 18.7.  retic inappropriately low. ths unremarkable mcv 100, consider refer to hematology for macrocytic anemia.   Hypotension Low blood pressures noted.  Patient is asymptomatic.  She has not noted on any other blood pressure lowering agents apart from amiodarone.  Could be baseline for her.  Continue to monitor for now. Tsh, am cortisol unremarkable.  AKI on CKDIII Baseline cr from 2012-2013 was 1.  Cr may not truly reflex her renal function due to no poor muscle mass. Cr peaked at 1.5, improving, today at 1.03  Check ua/urine culture, she denies urinary symptom,   Malnutrition: Body mass index is 17.23 kg/m.  Nutrition supplement   DVT Prophylaxis: On Eliquis Code Status: Full code Family Communication: Discussed with patient and her husband at bedside Disposition Plan: SNF when bed available     LOS: 5 days  Florencia Reasons MD PhD  [facility] Pager [phone] 05/05/2017, 2:55 PM  If 7PM-7AM, please contact night-coverage at [email or web address], password [facility]

## 2017-05-06 LAB — BASIC METABOLIC PANEL
ANION GAP: 8 (ref 5–15)
BUN: 21 mg/dL — ABNORMAL HIGH (ref 6–20)
CHLORIDE: 105 mmol/L (ref 101–111)
CO2: 22 mmol/L (ref 22–32)
Calcium: 7.5 mg/dL — ABNORMAL LOW (ref 8.9–10.3)
Creatinine, Ser: 0.81 mg/dL (ref 0.44–1.00)
GFR calc Af Amer: >60 mL/min (ref >60)
GLUCOSE: 102 mg/dL — AB (ref 65–99)
POTASSIUM: 4.3 mmol/L (ref 3.5–5.1)
Sodium: 135 mmol/L (ref 135–145)

## 2017-05-06 LAB — CBC
HEMATOCRIT: 24.4 % — AB (ref 36.0–46.0)
HEMOGLOBIN: 7.7 g/dL — AB (ref 12.0–15.0)
MCH: 32 pg (ref 26.0–34.0)
MCHC: 31.6 g/dL (ref 30.0–36.0)
MCV: 101.2 fL — AB (ref 78.0–100.0)
Platelets: 184 10*3/uL (ref 150–400)
RBC: 2.41 MIL/uL — AB (ref 3.87–5.11)
RDW: 17.3 % — ABNORMAL HIGH (ref 11.5–15.5)
WBC: 9.8 10*3/uL (ref 4.0–10.5)

## 2017-05-06 LAB — PREPARE RBC (CROSSMATCH)

## 2017-05-06 LAB — MAGNESIUM: Magnesium: 2 mg/dL (ref 1.7–2.4)

## 2017-05-06 MED ORDER — ACETAMINOPHEN 325 MG PO TABS: 650.0000 mg | ORAL_TABLET | Freq: Four times a day (QID) | ORAL | 0 refills | Status: DC | PRN

## 2017-05-06 MED ORDER — SODIUM CHLORIDE 0.9 % IV SOLN
Freq: Once | INTRAVENOUS | Status: AC
  Administered 2017-05-06: 15:00:00 via INTRAVENOUS

## 2017-05-06 MED ORDER — FUROSEMIDE 10 MG/ML IJ SOLN
40.0000 mg | Freq: Once | INTRAMUSCULAR | Status: AC
  Administered 2017-05-06: 40 mg via INTRAVENOUS
  Filled 2017-05-06: qty 4

## 2017-05-06 MED ORDER — SENNOSIDES-DOCUSATE SODIUM 8.6-50 MG PO TABS: 1.0000 | ORAL_TABLET | Freq: Every day | ORAL | 0 refills | Status: DC

## 2017-05-06 MED ORDER — METHYLPREDNISOLONE SODIUM SUCC 125 MG IJ SOLR: 60.0000 mg | Freq: Once | INTRAMUSCULAR | Status: DC

## 2017-05-06 MED ORDER — POLYETHYLENE GLYCOL 3350 17 G PO PACK: 17.0000 g | PACK | Freq: Every day | ORAL | 0 refills | Status: DC | PRN

## 2017-05-06 MED ORDER — CHOLECALCIFEROL 25 MCG (1000 UT) PO TABS: 1000.0000 [IU] | ORAL_TABLET | Freq: Every day | ORAL | 0 refills | Status: DC

## 2017-05-06 MED ORDER — ENSURE ENLIVE PO LIQD: 237.0000 mL | Freq: Two times a day (BID) | ORAL | 12 refills | Status: DC

## 2017-05-06 MED ORDER — FUROSEMIDE 40 MG PO TABS: 40.0000 mg | ORAL_TABLET | ORAL | 0 refills | Status: DC

## 2017-05-06 MED ORDER — FUROSEMIDE 10 MG/ML IJ SOLN: 40.0000 mg | Freq: Once | INTRAMUSCULAR | Status: DC

## 2017-05-06 NOTE — Progress Notes (Signed)
Patient has recieved blood and post lasix. She is eating and calm.

## 2017-05-06 NOTE — Progress Notes (Signed)
No labs needed after blood transfusion per verbal conversation with MD Erlinda Hong.

## 2017-05-06 NOTE — Progress Notes (Signed)
[facility] PROGRESS NOTE  Alexis Hamilton [id number]:[id number] DOB: 02/25/1929 DOA: 04/30/2017  PCP: Alexis Sams, MD  Brief History/Interval Summary: 83 year old Caucasian female with a past medical history of atrial fibrillation on Eliquis and amiodarone, history of mitral valve repair at  [facility] in 2010 was transferred from hospital in [place] for further management of right hip fracture and to get neurosurgical input regarding cervical dens fracture noted on cervical CT.   Reason for Visit: Right hip fracture  Consultants: Orthopedics.  Neurosurgery.  Procedures: Treatment of right subtrochanteric fracture with intramedullary implant  Antibiotics: None  Subjective/Interval History: -Patient feels better, currently denies pain -She denies confusion, husband at bedside  -she reports finally has a small bm yesterday  ROS: Denies any headaches.  Objective:  Vital Signs  Vitals:   05/06/17 0432 05/06/17 1411 05/06/17 1439 05/06/17 1502  BP: (!) 108/53 (!) 106/48 (!) 95/48 (!) 101/49  Pulse: 64 70 68 69  Resp: 16  20 18   Temp: 98.3 F (36.8 C) 98.2 F (36.8 C) 98 F (36.7 C) 98.4 F (36.9 C)  TempSrc: Oral Oral Oral Oral  SpO2: 95% 100% 100% 95%  Weight:      Height:        Intake/Output Summary (Last 24 hours) at 05/06/2017 1519 Last data filed at 05/06/2017 1447 Gross per 24 hour  Intake 510 ml  Output 200 ml  Net 310 ml   Filed Weights   04/30/17 1300  Weight: 49.9 kg (110 lb)    General appearance: very frail, in soft neck collar, need two person assist to transfer, Awake alert.  No distress. Noted to be in cervical neck collar. Resp: Clear to auscultation bilaterally.  No wheezing rales or rhonchi Cardio: S1-S2 is regular.  No S3-S4.  No rubs murmurs or bruit GI: Abdomen is soft.  Nontender nondistended. Extremities: No bruising noted over the right thigh. Neurologic: No focal deficits. aaox3  Lab Results:  Data Reviewed: I  have personally reviewed following labs and imaging studies  CBC: Recent Labs  Lab 05/02/17 0957 05/03/17 0644 05/04/17 0729 05/05/17 0323 05/06/17 0626  WBC 9.3 9.1 10.3 10.7* 9.8  HGB 7.9* 7.8* 8.1* 7.6* 7.7*  HCT 25.2* 24.1* 26.0* 24.0* 24.4*  MCV 100.8* 100.4* 98.9 100.4* 101.2*  PLT 155 140* 171 157 734    Basic Metabolic Panel: Recent Labs  Lab 05/02/17 0957 05/03/17 0644 05/04/17 0729 05/05/17 0323 05/06/17 0626  NA 134* 134* 134* 133* 135  K 4.1 4.5 4.1 4.3 4.3  CL 102 104 107 105 105  CO2 21* 21* 22 21* 22  GLUCOSE 155* 123* 111* 109* 102*  BUN 24* 31* 28* 26* 21*  CREATININE 1.31* 1.51* 1.14* 1.03* 0.81  CALCIUM 7.7* 7.9* 7.9* 7.5* 7.5*  MG  --   --   --   --  2.0    GFR: Estimated Creatinine Clearance: 39.3 mL/min (by C-G formula based on SCr of 0.81 mg/dL).  Liver Function Tests: Recent Labs  Lab 04/30/17 1820 05/04/17 0729  AST  --  27  ALT  --  <5*  ALKPHOS  --  127*  BILITOT  --  0.8  PROT  --  5.8*  ALBUMIN 2.8* 2.6*    Coagulation Profile: Recent Labs  Lab 04/30/17 1328  INR 1.66     Recent Results (from the past 240 hour(s))  MRSA PCR Screening     Status: Abnormal   Collection Time: 04/30/17  1:40 PM  Result Value Ref  Range Status   MRSA by PCR POSITIVE (A) NEGATIVE Final    Comment:        The GeneXpert MRSA Assay (FDA approved for NASAL specimens only), is one component of a comprehensive MRSA colonization surveillance program. It is not intended to diagnose MRSA infection nor to guide or monitor treatment for MRSA infections. RESULT CALLED TO, READ BACK BY AND VERIFIED WITHMinus Alexis Hamilton [id number] 04/30/17 A BROWNING   Culture, Urine     Status: Abnormal   Collection Time: 05/04/17  6:32 PM  Result Value Ref Range Status   Specimen Description URINE, CLEAN CATCH  Final   Special Requests NONE  Final   Culture <10,000 COLONIES/mL INSIGNIFICANT GROWTH (A)  Final   Report Status 05/05/2017 FINAL  Final      Radiology  Studies: No results found.   Medications:  Scheduled: . amiodarone  200 mg Oral Daily  . apixaban  2.5 mg Oral BID  . cholecalciferol  1,000 Units Oral Daily  . feeding supplement (ENSURE ENLIVE)  237 mL Oral BID BM  . furosemide  40 mg Intravenous Once  . polyethylene glycol  17 g Oral Daily  . senna-docusate  1 tablet Oral BID   Continuous: . methocarbamol (ROBAXIN)  IV     YSA:[id number] **OR** acetaminophen, alum & mag hydroxide-simeth, haloperidol lactate, HYDROcodone-acetaminophen, menthol-cetylpyridinium **OR** phenol, methocarbamol **OR** methocarbamol (ROBAXIN)  IV, metoCLOPramide **OR** metoCLOPramide (REGLAN) injection, morphine injection, ondansetron **OR** ondansetron (ZOFRAN) IV, oxyCODONE, polyethylene glycol  Assessment/Plan:  Active Problems:   Atrial fibrillation, chronic (HCC)   History of repair of mitral valve   Closed subtrochanteric fracture of hip, right, initial encounter [facility])    Right intertrochanteric fracture -Patient underwent surgery on 12/26. S/p Treatment of subtrochanteric fracture with intramedullary implant. CPT 27245   Pain control.  PT and OT evaluation.  She is on chronic anticoagulation for afib Low vitamin D noted.  Started on vitamin D replacement.  Chronic atrial fibrillation -Patient is on Eliquis at home.  Also noted to be on amiodarone.  Eliquis was held for surgery.  Patient was placed on IV heparin.  Now back on Eliquis.    Age-indeterminate fracture of dens This was seen on CT scan of the cervical spine from outside hospital.  Patient denies any neck pain currently.  Apparently also fell 2 months ago and had fractured her left elbow.  She required surgery for that.  She was in rehabilitation and was discharged from rehab last week.  She was sent home with home health.   neurosurgery Dr. Ronnald Ramp  Recommends continuing the cervical neck collar for now. Outpatient follow up  Macrocytic anemia/acute blood loss anemia -Baseline  hemoglobin not known. It was normal in 2012-2013. No labs since then. - Hemoglobin was around 10 at the outside facility.  Hemoglobin has dropped, Some of this could be due to operative blood loss.   - Anemia panel reviewed.  Ferritin 172.  B12 321.  Folate 18.7.  retic inappropriately low. ths unremarkable mcv 100, consider refer to hematology for macrocytic anemia.  She reports dyspnea on exertion, one unit prbc given on 12/31.  Hypotension Low blood pressures noted.  Patient is asymptomatic.  She has not noted on any other blood pressure lowering agents apart from amiodarone.  Could be baseline for her.  Continue to monitor for now. Tsh, am cortisol unremarkable.  AKI on CKDIII Baseline cr from 2012-2013 was 1.  Cr may not truly reflex her renal function due to no poor  muscle mass. Cr peaked at 1.5, improving, today at 0.8  urine culture insignificant growth,  she denies urinary symptom,   Malnutrition: Body mass index is 17.23 kg/m.  Nutrition supplement   DVT Prophylaxis: On Eliquis Code Status: Full code Family Communication: Discussed with patient and her husband at bedside Disposition Plan: SNF when bed available     LOS: 6 days   Florencia Reasons MD PhD  [facility] Pager [phone] 05/06/2017, 3:19 PM  If 7PM-7AM, please contact night-coverage at [email or web address], password [facility]

## 2017-05-06 NOTE — Progress Notes (Signed)
CSW following to facilitate discharge planning. Patient has manual review PASRR; cannot admit to SNF until PASRR has been approved. CSW obtained 30 day note from MD and sent over to [facility] Must for review.  CSW will continue to follow.  Laveda Abbe, [place] Clinical Social Worker [phone]

## 2017-05-06 NOTE — NC FL2 (Signed)
[place] LEVEL OF CARE SCREENING TOOL     IDENTIFICATION  Patient Name: Alexis Hamilton Birthdate: 05-02-1929 Sex: female Admission Date (Current Location): 04/30/2017  [facility] and [place] Number:  Herbalist and Address:  [facility] [place] [place] [address], [place], [place] [address]      Provider Number: [id number]  Attending Physician Name and Address:  Florencia Reasons, MD  Relative Name and Phone Number:       Current Level of Care: Hospital Recommended Level of Care: [place] Prior Approval Number:    Date Approved/Denied:   PASRR Number:    Discharge Plan: SNF    Current Diagnoses: Patient Active Problem List   Diagnosis Date Noted  . Atrial fibrillation, chronic ([place]) 04/30/2017  . History of repair of mitral valve 04/30/2017  . Closed subtrochanteric fracture of hip, right, initial encounter ([place]) 04/30/2017  . Cancer of lower-inner quadrant of female breast ([place]) 04/19/2011    Orientation RESPIRATION BLADDER Height & Weight     Time, Situation, Place, Self  O2(3L) Continent Weight: 110 lb (49.9 kg) Height:  5\' 7"  (170.2 cm)  BEHAVIORAL SYMPTOMS/MOOD NEUROLOGICAL BOWEL NUTRITION STATUS      Continent (Please see d/c summary)  AMBULATORY STATUS COMMUNICATION OF NEEDS Skin   Extensive Assist Verbally Surgical wounds(Right Hip, foam dressing)                       Personal Care Assistance Level of Assistance  Bathing, Feeding, Dressing Bathing Assistance: Maximum assistance Feeding assistance: Limited assistance Dressing Assistance: Maximum assistance     Functional Limitations Info  Sight, Speech, Hearing Sight Info: Adequate Hearing Info: Adequate Speech Info: Adequate    SPECIAL CARE FACTORS FREQUENCY  OT (By licensed OT), PT (By licensed PT)     PT Frequency: 3x OT Frequency: 3x            Contractures Contractures Info: Not present    Additional Factors Info  Code Status,  Allergies, Isolation Precautions Code Status Info: Full Allergies Info: Sulfa Antibiotics     Isolation Precautions Info: Contact, MRSA     Current Medications (05/06/2017):  This is the current hospital active medication list Current Facility-Administered Medications  Medication Dose Route Frequency Provider Last Rate Last Dose  . acetaminophen (TYLENOL) tablet 650 mg  650 mg Oral Q6H PRN Leandrew Koyanagi, MD   650 mg at 05/05/17 5809   Or  . acetaminophen (TYLENOL) suppository 650 mg  650 mg Rectal Q6H PRN Leandrew Koyanagi, MD      . alum & mag hydroxide-simeth (MAALOX/MYLANTA) 200-200-20 MG/5ML suspension 30 mL  30 mL Oral Q4H PRN Leandrew Koyanagi, MD      . amiodarone (PACERONE) tablet 200 mg  200 mg Oral Daily Oretha Milch D, MD   200 mg at 05/06/17 0855  . apixaban (ELIQUIS) tablet 2.5 mg  2.5 mg Oral BID Leandrew Koyanagi, MD   2.5 mg at 05/06/17 0855  . cholecalciferol (VITAMIN D) tablet 1,000 Units  1,000 Units Oral Daily Florencia Reasons, MD   1,000 Units at 05/06/17 [phone]  . feeding supplement (ENSURE ENLIVE) (ENSURE ENLIVE) liquid 237 mL  237 mL Oral BID BM Bonnielee Haff, MD   237 mL at 05/06/17 0856  . haloperidol lactate (HALDOL) injection 1 mg  1 mg Intravenous Q6H PRN Gardiner Barefoot, NP   1 mg at 05/02/17 2343  . HYDROcodone-acetaminophen (NORCO/VICODIN) 5-325 MG per  tablet 1-2 tablet  1-2 tablet Oral Q6H PRN Leandrew Koyanagi, MD   1 tablet at 05/03/17 0930  . menthol-cetylpyridinium (CEPACOL) lozenge 3 mg  1 lozenge Oral PRN Leandrew Koyanagi, MD       Or  . phenol (CHLORASEPTIC) mouth spray 1 spray  1 spray Mouth/Throat PRN Leandrew Koyanagi, MD      . methocarbamol (ROBAXIN) tablet 500 mg  500 mg Oral Q6H PRN Leandrew Koyanagi, MD       Or  . methocarbamol (ROBAXIN) 500 mg in dextrose 5 % 50 mL IVPB  500 mg Intravenous Q6H PRN Leandrew Koyanagi, MD      . metoCLOPramide (REGLAN) tablet 5-10 mg  5-10 mg Oral Q8H PRN Leandrew Koyanagi, MD       Or  . metoCLOPramide (REGLAN) injection 5-10 mg  5-10 mg  Intravenous Q8H PRN Leandrew Koyanagi, MD      . morphine 2 MG/ML injection 0.5 mg  0.5 mg Intravenous Q2H PRN Leandrew Koyanagi, MD   0.5 mg at 05/02/17 1401  . ondansetron (ZOFRAN) tablet 4 mg  4 mg Oral Q6H PRN Leandrew Koyanagi, MD       Or  . ondansetron [facility]) injection 4 mg  4 mg Intravenous Q6H PRN Leandrew Koyanagi, MD      . oxyCODONE (Oxy IR/ROXICODONE) immediate release tablet 5-10 mg  5-10 mg Oral Q4H PRN Leandrew Koyanagi, MD   5 mg at 05/04/17 1255  . polyethylene glycol (MIRALAX / GLYCOLAX) packet 17 g  17 g Oral Daily PRN Oretha Milch D, MD      . polyethylene glycol (MIRALAX / GLYCOLAX) packet 17 g  17 g Oral Daily Florencia Reasons, MD   17 g at 05/06/17 0856  . senna-docusate (Senokot-S) tablet 1 tablet  1 tablet Oral BID Florencia Reasons, MD   1 tablet at 05/06/17 2992     Discharge Medications: Please see discharge summary for a list of discharge medications.  Relevant Imaging Results:  Relevant Lab Results:   Additional Information SSN: [id number]  Eileen Stanford, LCSW

## 2017-05-06 NOTE — Progress Notes (Signed)
Physical Therapy Treatment Patient Details Name: Alexis Hamilton MRN: [id number] DOB: 1929/01/23 Today's Date: 05/06/2017    History of Present Illness 83 year old Caucasian female with a past medical history of atrial fibrillation on Eliquis and amiodarone, history of mitral valve repair was transferred from outside hospital for further management of right hip fracture s/p mechanical fall and to get neurosurgical input regarding cervical dens fracture noted on cervical CT. CT scan shows Healing nondisplaced type 2 dens fracture. Incomplete anterior arch of C1 centrally and probable remote nondisplaced fracture on the right. Pt is now s/p IM nail R hip fracture 12/26    PT Comments    Pt fatigued and getting blood with a Hgb of 7.7 this AM.  Pt was able to tolerate supine LE exercises and sitting EOB, but did not feel she had the strength to attempt standing EOB.  Pt remains appropriate for SNF level rehab at discharge.    Follow Up Recommendations  SNF;Supervision/Assistance - 24 hour     Equipment Recommendations  Rolling walker with 5" wheels;3in1 (PT)    Recommendations for Other Services   NA     Precautions / Restrictions Precautions Precautions: Fall Precaution Comments: hx of falls, post op pain/weakness Required Braces or Orthoses: Cervical Brace Cervical Brace: Soft collar Restrictions RLE Weight Bearing: Weight bearing as tolerated    Mobility  Bed Mobility Overal bed mobility: Needs Assistance Bed Mobility: Supine to Sit;Sit to Supine     Supine to sit: Mod assist;HOB elevated Sit to supine: Mod assist   General bed mobility comments: Mod assist to help progress bil legs to EOB and support trunk to get to sitting.  HOB elevated to ~45 degrees to help with transition up and flattened for transition down.    Transfers                 General transfer comment: Pt was receiving blood and did not feel up for attempts at standing.          Balance  Overall balance assessment: Needs assistance Sitting-balance support: Feet supported;Bilateral upper extremity supported Sitting balance-Leahy Scale: Fair Sitting balance - Comments: supervision EOB with at least one arm on the bed rail for balance. Pt sat EOB >10 mins working on sitting tolerance, LE exercises and breathing.                                     Cognition Arousal/Alertness: Awake/alert Behavior During Therapy: WFL for tasks assessed/performed Overall Cognitive Status: Within Functional Limits for tasks assessed(not specific deficits noted, but also not specifically teste)                                        Exercises Total Joint Exercises Ankle Circles/Pumps: AROM;Both;20 reps Quad Sets: AROM;Both;10 reps Heel Slides: AAROM;Right;10 reps Hip ABduction/ADduction: AAROM;Right;10 reps Long Arc Quad: Right;10 reps;AAROM;AROM;Left        Pertinent Vitals/Pain Pain Assessment: Faces Faces Pain Scale: Hurts even more Pain Location: right leg and thigh Pain Descriptors / Indicators: Grimacing;Guarding Pain Intervention(s): Limited activity within patient's tolerance;Monitored during session;Repositioned;Ice applied           PT Goals (current goals can now be found in the care plan section) Acute Rehab PT Goals Patient Stated Goal: return home  Progress towards PT goals: Progressing toward goals  Frequency    Min 3X/week      PT Plan Current plan remains appropriate       AM-PAC PT "6 Clicks" Daily Activity  Outcome Measure  Difficulty turning over in bed (including adjusting bedclothes, sheets and blankets)?: Unable Difficulty moving from lying on back to sitting on the side of the bed? : Unable Difficulty sitting down on and standing up from a chair with arms (e.g., wheelchair, bedside commode, etc,.)?: Unable Help needed moving to and from a bed to chair (including a wheelchair)?: Total Help needed walking in  hospital room?: Total Help needed climbing 3-5 steps with a railing? : Total 6 Click Score: 6    End of Session Equipment Utilized During Treatment: Oxygen Activity Tolerance: Patient limited by pain;Patient limited by fatigue Patient left: in bed;with call bell/phone within reach;with family/visitor present   PT Visit Diagnosis: Unsteadiness on feet (R26.81);Other abnormalities of gait and mobility (R26.89);Repeated falls (R29.6);Muscle weakness (generalized) (M62.81);History of falling (Z91.81);Pain Pain - Right/Left: Right Pain - part of body: Hip     Time: 8676-7209 PT Time Calculation (min) (ACUTE ONLY): 34 min  Charges:  $Therapeutic Exercise: 8-22 mins $Therapeutic Activity: 8-22 mins          Gae Bihl B. [place], [place], DPT [phone]            05/06/2017, 4:54 PM

## 2017-05-06 NOTE — Progress Notes (Signed)
This RN spoke with MD XU concerning patient's discharge tomr. Day RN to call SW tomr for clarity on when patient will be leaving. Will pass on to night shift nurse.

## 2017-05-07 DIAGNOSIS — D539 Nutritional anemia, unspecified: Secondary | ICD-10-CM

## 2017-05-07 LAB — BASIC METABOLIC PANEL
Anion gap: 8 (ref 5–15)
BUN: 23 mg/dL — ABNORMAL HIGH (ref 6–20)
CALCIUM: 7.6 mg/dL — AB (ref 8.9–10.3)
CO2: 22 mmol/L (ref 22–32)
CREATININE: 0.85 mg/dL (ref 0.44–1.00)
Chloride: 107 mmol/L (ref 101–111)
GFR calc non Af Amer: >60 mL/min (ref >60)
GLUCOSE: 87 mg/dL (ref 65–99)
Potassium: 4.1 mmol/L (ref 3.5–5.1)
Sodium: 137 mmol/L (ref 135–145)

## 2017-05-07 LAB — TYPE AND SCREEN
ABO/RH(D): O POS
Antibody Screen: NEGATIVE
UNIT DIVISION: 0

## 2017-05-07 LAB — CBC
HEMATOCRIT: 28.6 % — AB (ref 36.0–46.0)
Hemoglobin: 9.2 g/dL — ABNORMAL LOW (ref 12.0–15.0)
MCH: 31.6 pg (ref 26.0–34.0)
MCHC: 32.2 g/dL (ref 30.0–36.0)
MCV: 98.3 fL (ref 78.0–100.0)
Platelets: 193 10*3/uL (ref 150–400)
RBC: 2.91 MIL/uL — ABNORMAL LOW (ref 3.87–5.11)
RDW: 18.1 % — AB (ref 11.5–15.5)
WBC: 9.5 10*3/uL (ref 4.0–10.5)

## 2017-05-07 LAB — BPAM RBC
Blood Product Expiration Date: 201901072359
ISSUE DATE / TIME: 201812311432
UNIT TYPE AND RH: 9500

## 2017-05-07 NOTE — Clinical Social Work Placement (Signed)
   CLINICAL SOCIAL WORK PLACEMENT  NOTE  Date:  05/07/2017  Patient Details  Name: Alexis Hamilton MRN: [id number] Date of Birth: Aug 01, 1928  Clinical Social Work is seeking post-discharge placement for this patient at the   level of care (*CSW will initial, date and re-position this form in  chart as items are completed):  Yes   Patient/family provided with [place] Work Department's list of facilities offering this level of care within the geographic area requested by the patient (or if unable, by the patient's family).  Yes   Patient/family informed of their freedom to choose among providers that offer the needed level of care, that participate in Medicare, Medicaid or managed care program needed by the patient, have an available bed and are willing to accept the patient.  Yes   Patient/family informed of [facility]'s ownership interest in [facility] and [facility], as well as of the fact that they are under no obligation to receive care at these facilities.  PASRR submitted to EDS on 05/06/17     PASRR number received on       Existing PASRR number confirmed on       FL2 transmitted to all facilities in geographic area requested by pt/family on 05/06/17     FL2 transmitted to all facilities within larger geographic area on       Patient informed that his/her managed care company has contracts with or will negotiate with certain facilities, including the following:        Yes   Patient/family informed of bed offers received.  Patient chooses bed at [facility]     Physician recommends and patient chooses bed at      Patient to be transferred to [facility] on 05/07/17.  Patient to be transferred to facility by [facility]     Patient family notified on 05/07/17 of transfer.  Name of family member notified:  spouse contacted     PHYSICIAN       Additional Comment:     _______________________________________________ Normajean Baxter, LCSW 05/07/2017, 9:25 AM

## 2017-05-07 NOTE — Clinical Social Work Note (Signed)
Clinical Social Work Assessment  Patient Details  Name: Alexis Hamilton MRN: [id number] Date of Birth: 1928/11/19  Date of referral:  05/06/17               Reason for consult:  Facility Placement                Permission sought to share information with:  Facility Sport and exercise psychologist, Family Supports Permission granted to share information::  Yes, Verbal Permission Granted  Name::     Carlyon Prows::  [facility] [place]  Relationship::  Husband  Contact Information:     Housing/Transportation Living arrangements for the past 2 months:  [place] of Information:  Patient, Spouse Patient Interpreter Needed:  None Criminal Activity/Legal Involvement Pertinent to Current Situation/Hospitalization:  No - Comment as needed Significant Relationships:  Spouse Lives with:  Self, Spouse Do you feel safe going back to the place where you live?  Yes Need for family participation in patient care:  No (Coment)  Care giving concerns:  Patient has been living at home with spouse, but will require short term rehab at discharge to recover ability to care for self with spouse's support upon return home.   Social Worker assessment / plan:  CSW met with patient and patient's husband at bedside to discuss SNF recommendation and placement options. CSW received information from patient and patient's husband, and apologized that no one from social work had met with them over the weekend. CSW acknowledged frustration and discussed next steps to move forward with SNF placement. CSW completed referral and faxed to [facility]. CSW spoke with admissions coordinator who indicated that they would not be able to accept patient late due to the patient receiving a unit of blood, will be able to take the patient tomorrow. CSW updated MD and patient. CSW will continue to follow.  Employment status:  Retired Forensic scientist:  Medicare PT Recommendations:  [place] /  Referral to community resources:  [place]  Patient/Family's Response to care:  Patient and patient's husband agreeable to SNF placement.  Patient/Family's Understanding of and Emotional Response to Diagnosis, Current Treatment, and Prognosis:  Patient and patient's husband were frustrated that a Education officer, museum hadn't been to see them yet and they had been waiting all weekend. Patient and patient's husband discussed how they had already called over to [facility] to get the process started but they didn't know what paperwork they needed and they felt like they were helpless. Patient and patient's husband appreciated apology and were pleased that CSW was able to complete referral and arrange discharge to [facility] for tomorrow.  Emotional Assessment Appearance:  Appears stated age Attitude/Demeanor/Rapport:    Affect (typically observed):  Appropriate, Frustrated Orientation:  Oriented to Self, Oriented to Place, Oriented to  Time, Oriented to Situation Alcohol / Substance use:  Not Applicable Psych involvement (Current and /or in the community):  No (Comment)  Discharge Needs  Concerns to be addressed:  Care Coordination Readmission within the last 30 days:  No Current discharge risk:  Physical Impairment, Dependent with Mobility Barriers to Discharge:  Continued Medical Work up   [employer], [place] 05/07/2017, 8:49 AM

## 2017-05-07 NOTE — Social Work (Signed)
CSW reviewed chart.   CSW will fax DC summary to SNF.  CSW will f/u on transition to SNF.  Elissa Hefty, LCSW Clinical Social Worker [phone]

## 2017-05-07 NOTE — Social Work (Signed)
Clinical Social Worker facilitated patient discharge including contacting patient family and facility to confirm patient discharge plans.  Clinical information faxed to facility and family agreeable with plan.    CSW arranged ambulance transport via [facility] to [facility].    RN to call [phone] to give report prior to discharge.  Clinical Social Worker will sign off for now as social work intervention is no longer needed. Please consult [place] again if new need arises.  Elissa Hefty, LCSW Clinical Social Worker [phone]

## 2017-05-07 NOTE — Discharge Summary (Addendum)
Discharge Summary  Alexis Hamilton OEU:[id number] DOB: Jul 14, 1928  PCP: [name], MD  Admit date: 04/30/2017 Discharge date: 05/07/2017  Time spent: >54mins, more than 50% time spent on coordination of care.  Recommendations for Outpatient Follow-up:  1. F/u with SNF MD for hospital discharge follow up, repeat cbc/bmp at follow up 2. F/u with orthopedics Dr Frankey Shown for suture removal and wound check 3. F/u with neurosurgery Dr Sherley Bounds in two weeks, continue neck collar for  Age-indeterminate fracture of dens 4. F/u with cardiology for afib 5. pmd to refer patient to hematology for macroytic anemia  Discharge Diagnoses:  Active Hospital Problems   Diagnosis Date Noted  . Atrial fibrillation, chronic ([place]) 04/30/2017  . History of repair of mitral valve 04/30/2017  . Closed subtrochanteric fracture of hip, right, initial encounter [facility]) 04/30/2017    Resolved Hospital Problems   Diagnosis Date Noted Date Resolved  . Femur fracture, right ([place]) 04/30/2017 05/01/2017    Discharge Condition: stable  Diet recommendation: heart healthy  Filed Weights   04/30/17 1300  Weight: 49.9 kg (110 lb)    History of present illness: (per admitting MD Dr Ellouise Newer) PCP: [name], MD  Outpatient Specialists: None Patient coming from:Home At her baseline ambulates with walker  Chief Complaint: hip pain   HPI: Alexis Hamilton is a 84 y.o. female with medical history significant for atrial fibrillation currently on Eliquis and amiodarone and history of mitral valve repair (at [facility] in 2010) who presents as a transfer from outside hospital on 04/30/2017 with right hip pain and was found to have right comminuted fracture of the femur.    Patient states she was in her usual state of health until this morning while trying to walk across the room she fell while using her walker.  She landed on the side of her bed on her right hip.  Patient denied any prodromal symptoms  prior to the fall.  She denies chest pain, lightheadedness, palpitations.  Of note, patient recently had surgery on her elbow in October after a fall in her kitchen was also mechanical in nature.  Patient completed rehabilitation at a facility and was discharged last week.  She has been doing well at home with physical therapy 3 times a week.  She lives with her husband.  Patient is adherent with amiodarone for her chronic atrial fibrillation.  She did receive a maze procedure 2010 at [facility] as well as mitral valve repair.  She was previously on Coumadin but switched to Eliquis after elbow surgery in October of this year.  She denies any bleeding episodes while on Eliquis.  Of note she did scratch her right elbow after the fall today.   ED Course: Afebrile, O2 saturation 89% on room air placed on 2 L with improvement to 94%.  Otherwise he dynamically stable.  Lab work significant for creatinine 1.31, BUN 28, potassium 3.8, calcium 7.8.  Hemoglobin 10.8.  INR 1.7, troponin 0 0.025 (-).  UA negative Chest x-ray: Chronic lung changes no acute findings transverse fracture through the left humeral neck with medial displacement of the distal fragment  X-ray right pelvis comminuted fracture of the proximal right femur with extension into the lesser trochanter, slight medial displacement of the lesser trochanter and distal fragment.  Osteoarthrosis of the hips, osteopenia  X-ray right shoulder no evidence of fracture or dislocation Head CT without contrast no acute intracranial process.  Stable encephalomalacia right temporal and parietal lobes  Cervical spine CT without  contrast age-indeterminate fracture to the base of the dens.  Lucencies of the left and right posterior arch of C1 (these findings could be related to acute or chronic injury) questionable minimally displaced fracture of the sternal manubrium.  Patient given IV Zofran and IV morphine 4 mg in the ED.     Hospital Course:  Active  Problems:   Atrial fibrillation, chronic (HCC)   History of repair of mitral valve   Closed subtrochanteric fracture of hip, right, initial encounter [facility])   Right intertrochanteric fracture -Patient underwent surgery on 12/26. S/p Treatment of subtrochanteric fracture with intramedullary implant. CPT 27245   Pain control.  PT and OT evaluation.  She is on chronic anticoagulation for afib Low vitamin D noted.  Started on vitamin D replacement.  Chronic atrial fibrillation -Patient is on Eliquis at home.  Also noted to be on amiodarone.  Eliquis was held for surgery.  Patient was placed on IV heparin.  Now back on Eliquis.  -she is to follow up with cardiology   Age-indeterminate fracture of dens This was seen on CT scan of the cervical spine from outside hospital.  Patient denies any neck pain currently.  Apparently also fell 2 months ago and had fractured her left elbow.  She required surgery for that.  She was in rehabilitation and was discharged from rehab last week.  She was sent home with home health.   neurosurgery Dr. Ronnald Ramp  Recommends continuing the cervical neck collar for now. Outpatient follow up  Macrocytic anemia/acute blood loss anemia -Baseline hemoglobin not known. It was normal in 2012-2013. No labs since then. - Hemoglobin was around 10 at the outside facility.  Hemoglobin has dropped, Some of this could be due to operative blood loss.   - Anemia panel reviewed.  Ferritin 172.  B12 321.  Folate 18.7.  retic inappropriately low. ths unremarkable mcv 100, consider refer to hematology for macrocytic anemia.  -She reports dyspnea on exertion, one unit prbc given on 12/31. hgb 9.2 at discharge. No sign of bleeding.  Hypotension, sbp range from high 90's to 110's  Patient is asymptomatic.  She has not noted on any other blood pressure lowering agents apart from amiodarone.  Could be baseline for her.  Continue to monitor for now. Tsh, am cortisol unremarkable.  AKI on  CKDIII Baseline cr from 2012-2013 was 1.  Cr may not truly reflex her renal function due to no poor muscle mass. Cr peaked at 1.5, improving, today at 0.8  urine culture insignificant growth,  she denies urinary symptom,   Malnutrition: Body mass index is 17.23 kg/m.  Nutrition supplement  MRSA colonization: finished decolonization protocol.   DVT Prophylaxis: On Eliquis Code Status: Full code Family Communication: Discussed with patient and her husband at bedside Disposition Plan: SNF    Procedures:  S/p Treatment of subtrochanteric fracture with intramedullary implant. On 12/26  prbc transfusion on 12/31  Consultations:  Orthopedics   neurosurgery  Discharge Exam: BP 110/61 (BP Location: Left Arm)   Pulse 81   Temp 98.3 F (36.8 C) (Oral)   Resp 16   Ht 5\' 7"  (1.702 m)   Wt 49.9 kg (110 lb)   SpO2 95%   BMI 17.23 kg/m   General: very frail, thin, in soft neck collar, NAD, aaox3 Cardiovascular: RRR Respiratory: mild basilar rales, no wheezing, no rhonchi Extremity: right thigh post op changes, dressing in place. Some associated edema  Discharge Instructions You were cared for by a hospitalist during your  hospital stay. If you have any questions about your discharge medications or the care you received while you were in the hospital after you are discharged, you can call the unit and asked to speak with the hospitalist on call if the hospitalist that took care of you is not available. Once you are discharged, your primary care physician will handle any further medical issues. Please note that NO REFILLS for any discharge medications will be authorized once you are discharged, as it is imperative that you return to your primary care physician (or establish a relationship with a primary care physician if you do not have one) for your aftercare needs so that they can reassess your need for medications and monitor your lab values.  Discharge Instructions    Diet -  low sodium heart healthy   Complete by:  As directed    Increase activity slowly   Complete by:  As directed    Weight bearing as tolerated   Complete by:  As directed      Allergies as of 05/07/2017      Reactions   Sulfa Antibiotics    Childhood reaction unknown      Medication List    TAKE these medications   acetaminophen 325 MG tablet Commonly known as:  TYLENOL Take 2 tablets (650 mg total) by mouth every 6 (six) hours as needed for mild pain (or Fever >/= 101).   amiodarone 200 MG tablet Commonly known as:  PACERONE Take 200 mg by mouth daily.   apixaban 2.5 MG Tabs tablet Commonly known as:  ELIQUIS Take 1 tablet (2.5 mg total) by mouth 2 (two) times daily. What changed:  Another medication with the same name was removed. Continue taking this medication, and follow the directions you see here.   CALCIUM PLUS VITAMIN D PO Take 1 tablet by mouth daily.   Cholecalciferol 1000 units tablet Take 1 tablet (1,000 Units total) by mouth daily.   feeding supplement (ENSURE ENLIVE) Liqd Take 237 mLs by mouth 2 (two) times daily between meals.   furosemide 40 MG tablet Commonly known as:  LASIX Take 1 tablet (40 mg total) by mouth every Monday, Wednesday, and Friday. What changed:  when to take this   multivitamin tablet Take 1 tablet by mouth daily as needed (supplemental).   oxyCODONE 5 MG immediate release tablet Commonly known as:  Oxy IR/ROXICODONE Take 1-3 tablets (5-15 mg total) by mouth every 4 (four) hours as needed.   polyethylene glycol packet Commonly known as:  MIRALAX / GLYCOLAX Take 17 g by mouth daily as needed for mild constipation.   senna-docusate 8.6-50 MG tablet Commonly known as:  Senokot-S Take 1 tablet by mouth at bedtime.            Discharge Care Instructions  (From admission, onward)        Start     Ordered   05/01/17 0000  Weight bearing as tolerated     05/01/17 1801     Allergies  Allergen Reactions  . Sulfa  Antibiotics     Childhood reaction unknown   Follow-up Information    Leandrew Koyanagi, MD Follow up in 2 week(s).   Specialty:  Orthopedic Surgery Why:  For suture removal, For wound re-check Contact information: [place] [address] [phone]        Eustace Moore, MD. Schedule an appointment as soon as possible for a visit in 2 week(s).   Specialty:  Neurosurgery  Contact information: [address] [place] [address] [phone]        [name], MD Follow up.   Specialty:  Internal Medicine Why:  [place], [place], [phone] pmd to refer to hematology for macrocytic anemia Contact information: [address] [place] [address] [phone]            The results of significant diagnostics from this hospitalization (including imaging, microbiology, ancillary and laboratory) are listed below for reference.    Significant Diagnostic Studies: Ct Cervical Spine Wo Contrast  Result Date: 05/02/2017 CLINICAL DATA:  Evaluate dens fracture.  Patient fell 1 month ago. EXAM: CT CERVICAL SPINE WITHOUT CONTRAST TECHNIQUE: Multidetector CT imaging of the cervical spine was performed without intravenous contrast. Multiplanar CT image reconstructions were also generated. COMPARISON:  None. FINDINGS: Alignment: Markedly exaggerated thoracic kyphosis and somewhat exaggerated cervical lordosis. The overall alignment is maintained. Skull base and vertebrae: There is a healing type 2 dens fracture which is nondisplaced. There are definitely areas of bony ingrowth both medially and laterally but the fracture is still apparent. There is an incomplete anterior arch of C1 centrally. There is also a probable remote ununited fracture involving the anterior arch of C1 on the right side. Soft tissues and spinal canal: No abnormal prevertebral soft tissue swelling. No paraspinal process. The spinal canal is quite generous.  No intraspinal hematoma. Disc levels: No significant cervical disc protrusions, spinal or foraminal stenosis. Upper chest: Emphysematous changes but no worrisome pulmonary lesions. Other: No obvious neck masses. IMPRESSION: 1. Healing nondisplaced type 2 dens fracture. 2. Incomplete anterior arch of C1 centrally and probable remote nondisplaced fracture on the right. 3. Very generous spinal canal.  No spinal stenosis. Electronically Signed   By: Marijo Sanes M.D.   On: 05/02/2017 15:20   Dg C-arm 1-60 Min  Result Date: 05/01/2017 CLINICAL DATA:  Intramedullary (Im) Nail intertrochantric Right femur. EXAM: RIGHT FEMUR 2 VIEWS; DG C-ARM 61-120 MIN COMPARISON:  None. FINDINGS: Four intraoperative fluoroscopic spot images of the right hip show fixation of the right femoral neck fracture with intramedullary rod and dynamic hip screws. Osseous alignment is anatomic. Hardware appears intact and appropriately positioned. Fluoroscopy was provided for 2 minutes and 2 seconds. IMPRESSION: Internal fixation hardware traversing the right femoral neck fracture site appears intact and appropriately position. No evidence of surgical complicating feature. Electronically Signed   By: Franki Cabot M.D.   On: 05/01/2017 19:59   Dg Hip Unilat With Pelvis 2-3 Views Right  Result Date: 05/01/2017 CLINICAL DATA:  Status post fall, with right hip pain. Initial encounter. EXAM: DG HIP (WITH OR WITHOUT PELVIS) 2-3V RIGHT COMPARISON:  None. FINDINGS: There is a comminuted right femoral intertrochanteric fracture, with displaced greater and lesser trochanteric fragments. The right femoral head remains seated at the acetabulum. The left hip joint is unremarkable in appearance. The sacroiliac joints are grossly unremarkable. Calcified uterine fibroids are noted. The visualized bowel gas pattern is grossly unremarkable. Soft tissue swelling is noted at the right hip. IMPRESSION: Comminuted right femoral intertrochanteric fracture, with  displaced greater and lesser trochanteric fragments. Electronically Signed   By: Garald Balding M.D.   On: 05/01/2017 00:37   Dg Femur, Min 2 Views Right  Result Date: 05/01/2017 CLINICAL DATA:  Intramedullary (Im) Nail intertrochantric Right femur. EXAM: RIGHT FEMUR 2 VIEWS; DG C-ARM 61-120 MIN COMPARISON:  None. FINDINGS: Four intraoperative fluoroscopic spot images of the right hip show fixation of the right  femoral neck fracture with intramedullary rod and dynamic hip screws. Osseous alignment is anatomic. Hardware appears intact and appropriately positioned. Fluoroscopy was provided for 2 minutes and 2 seconds. IMPRESSION: Internal fixation hardware traversing the right femoral neck fracture site appears intact and appropriately position. No evidence of surgical complicating feature. Electronically Signed   By: Franki Cabot M.D.   On: 05/01/2017 19:59    Microbiology: Recent Results (from the past 240 hour(s))  MRSA PCR Screening     Status: Abnormal   Collection Time: 04/30/17  1:40 PM  Result Value Ref Range Status   MRSA by PCR POSITIVE (A) NEGATIVE Final    Comment:        The GeneXpert MRSA Assay (FDA approved for NASAL specimens only), is one component of a comprehensive MRSA colonization surveillance program. It is not intended to diagnose MRSA infection nor to guide or monitor treatment for MRSA infections. RESULT CALLED TO, READ BACK BY AND VERIFIED WITH: R LEIGHT RN [id number] 04/30/17 A BROWNING   Culture, Urine     Status: Abnormal   Collection Time: 05/04/17  6:32 PM  Result Value Ref Range Status   Specimen Description URINE, CLEAN CATCH  Final   Special Requests NONE  Final   Culture <10,000 COLONIES/mL INSIGNIFICANT GROWTH (A)  Final   Report Status 05/05/2017 FINAL  Final     Labs: Basic Metabolic Panel: Recent Labs  Lab 05/03/17 0644 05/04/17 0729 05/05/17 0323 05/06/17 0626 05/07/17 0430  NA 134* 134* 133* 135 137  K 4.5 4.1 4.3 4.3 4.1  CL 104 107  105 105 107  CO2 21* 22 21* 22 22  GLUCOSE 123* 111* 109* 102* 87  BUN 31* 28* 26* 21* 23*  CREATININE 1.51* 1.14* 1.03* 0.81 0.85  CALCIUM 7.9* 7.9* 7.5* 7.5* 7.6*  MG  --   --   --  2.0  --    Liver Function Tests: Recent Labs  Lab 04/30/17 1820 05/04/17 0729  AST  --  27  ALT  --  <5*  ALKPHOS  --  127*  BILITOT  --  0.8  PROT  --  5.8*  ALBUMIN 2.8* 2.6*   No results for input(s): LIPASE, AMYLASE in the last 168 hours. No results for input(s): AMMONIA in the last 168 hours. CBC: Recent Labs  Lab 05/03/17 0644 05/04/17 0729 05/05/17 0323 05/06/17 0626 05/07/17 0430  WBC 9.1 10.3 10.7* 9.8 9.5  HGB 7.8* 8.1* 7.6* 7.7* 9.2*  HCT 24.1* 26.0* 24.0* 24.4* 28.6*  MCV 100.4* 98.9 100.4* 101.2* 98.3  PLT 140* 171 157 184 193   Cardiac Enzymes: No results for input(s): CKTOTAL, CKMB, CKMBINDEX, TROPONINI in the last 168 hours. BNP: BNP (last 3 results) No results for input(s): BNP in the last 8760 hours.  ProBNP (last 3 results) No results for input(s): PROBNP in the last 8760 hours.  CBG: No results for input(s): GLUCAP in the last 168 hours.     Signed:  Florencia Reasons MD, PhD  [facility] 05/07/2017, 8:52 AM

## 2017-05-13 ENCOUNTER — Ambulatory Visit (INDEPENDENT_AMBULATORY_CARE_PROVIDER_SITE_OTHER): Payer: No Typology Code available for payment source

## 2017-05-13 ENCOUNTER — Encounter (INDEPENDENT_AMBULATORY_CARE_PROVIDER_SITE_OTHER): Payer: Self-pay | Admitting: Orthopaedic Surgery

## 2017-05-13 ENCOUNTER — Ambulatory Visit (INDEPENDENT_AMBULATORY_CARE_PROVIDER_SITE_OTHER): Payer: No Typology Code available for payment source | Admitting: Orthopaedic Surgery

## 2017-05-13 DIAGNOSIS — S7221XA Displaced subtrochanteric fracture of right femur, initial encounter for closed fracture: Secondary | ICD-10-CM

## 2017-05-13 NOTE — Progress Notes (Signed)
Patient ID: Alexis Hamilton, female   DOB: 09-29-1928, 84 y.o.   MRN: [id number]  Patient is two-week status post intramedullary fixation right subtrochanteric femur fracture.  She is overall doing well.  Denies any constitutional symptoms.  She is currently living in a skilled nursing facility.  Her incisions have healed however she does have some cellulitis around the proximal incisions that appears to be more superficial.  There is no fluctuance or induration.  There is no significant drainage.  Her x-rays show stable fixation and alignment of the fractures.  I recommend 10 days of Keflex for the superficial infection.  Staples are removed today.  Continue with physical therapy.  Weight-bear as tolerated.  Follow-up in 2 weeks for wound recheck.

## 2017-05-15 ENCOUNTER — Telehealth (INDEPENDENT_AMBULATORY_CARE_PROVIDER_SITE_OTHER): Payer: Self-pay | Admitting: Orthopaedic Surgery

## 2017-05-15 NOTE — Telephone Encounter (Signed)
See note from patient

## 2017-05-15 NOTE — Telephone Encounter (Signed)
Patients husband called on behalf of wife, so they don't have to make a long trip to [place] on the 22nd he was requesting a phone call to Dr. Lesly Dukes to discuss treatment options. He's located at [facility] in [place]. [phone]  If you could please give patient a call back, after spoken with Dr. Lesly Dukes. [phone]

## 2017-05-15 NOTE — Telephone Encounter (Signed)
If they wish to follow up with Dr. Lesly Dukes, we can fax over all of our records.

## 2017-05-17 ENCOUNTER — Telehealth (INDEPENDENT_AMBULATORY_CARE_PROVIDER_SITE_OTHER): Payer: Self-pay | Admitting: Orthopaedic Surgery

## 2017-05-17 NOTE — Telephone Encounter (Signed)
IC and s/w husband and he does want [place] to fax the notes, IC and fax number is [phone].  I faxed last office note and the op note to them.

## 2017-05-17 NOTE — Telephone Encounter (Signed)
See note, please call?   I did send the office notes and did state on that fax that patient would like to followup with Dr Lesly Dukes for the hip.

## 2017-05-17 NOTE — Telephone Encounter (Signed)
Baker Janus from Dr. Marzella Schlein office in [place], [place] would like to know if you want him to follow up with the patient in regards to her hip or would you like to keep seeing her for this issue.  Dr. Lesly Dukes would like to speak to Dr. Erlinda Hong.  YE#[phone] ext. [phone].  Thank you.

## 2017-05-17 NOTE — Telephone Encounter (Signed)
I left a message.

## 2017-05-23 ENCOUNTER — Other Ambulatory Visit: Payer: Self-pay | Admitting: Student

## 2017-05-23 DIAGNOSIS — S12091D Other nondisplaced fracture of first cervical vertebra, subsequent encounter for fracture with routine healing: Secondary | ICD-10-CM

## 2017-05-27 ENCOUNTER — Ambulatory Visit (INDEPENDENT_AMBULATORY_CARE_PROVIDER_SITE_OTHER): Payer: Medicare Other | Admitting: Orthopaedic Surgery

## 2017-05-28 ENCOUNTER — Ambulatory Visit (INDEPENDENT_AMBULATORY_CARE_PROVIDER_SITE_OTHER): Payer: Medicare Other | Admitting: Orthopaedic Surgery

## 2017-06-17 ENCOUNTER — Ambulatory Visit
Admission: RE | Admit: 2017-06-17 | Discharge: 2017-06-17 | Disposition: A | Discharge status: Home / Self Care | Payer: Medicare Other | Source: Ambulatory Visit | Attending: Student | Admitting: Student

## 2017-06-17 DIAGNOSIS — S12091D Other nondisplaced fracture of first cervical vertebra, subsequent encounter for fracture with routine healing: Secondary | ICD-10-CM

## 2018-03-15 IMAGING — CT CT CERVICAL SPINE W/O CM
2 series · 15 of 20 positions shown, 18 images · non-contrast
Comparison: 05/02/2017

CLINICAL DATA: History of multiple falls. Follow-up cervical spine
fracture.

EXAM:
CT CERVICAL SPINE WITHOUT CONTRAST
TECHNIQUE: Multidetector CT imaging of the cervical spine was performed without
intravenous contrast. Multiplanar CT image reconstructions were also
generated.

[Series 3: cspine soft · axial · 0.27mm/px · z∈[-120,-10]mm · 12 of 67 slices shown, 15 images]
[im 6/67  soft-tissue]
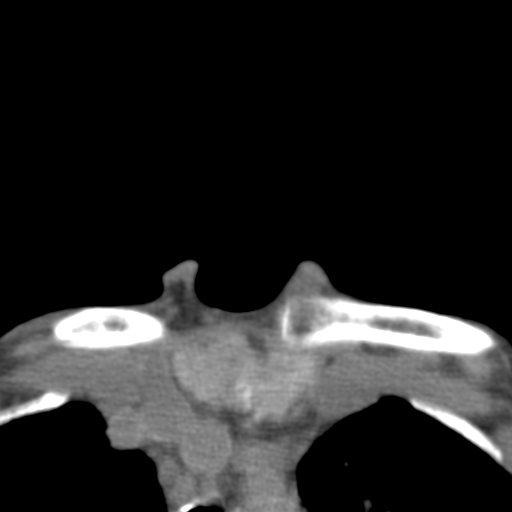
[im 6/67  bone]
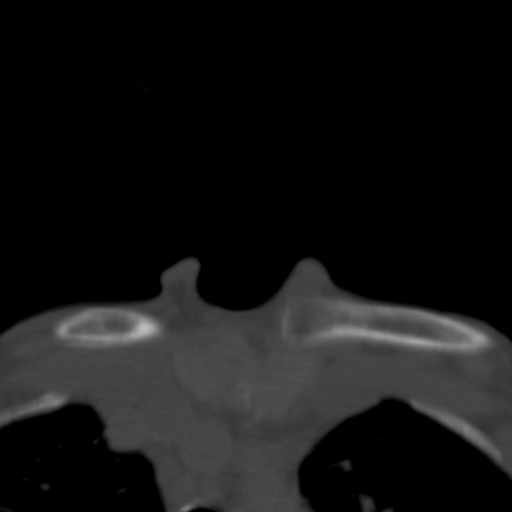
[im 11/67  bone]
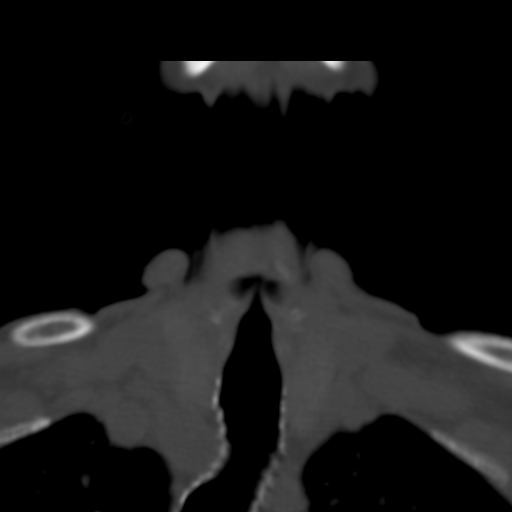
[im 16/67  bone]
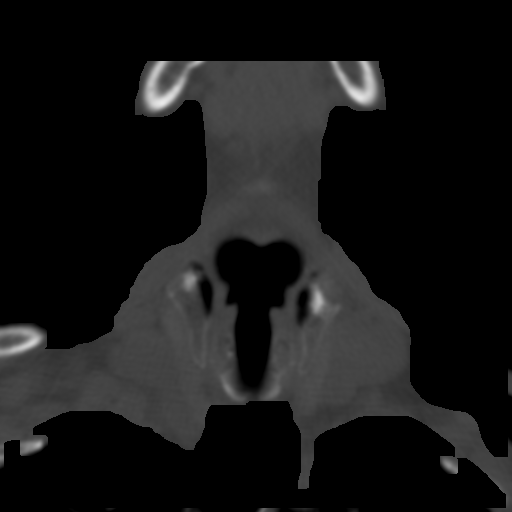
[im 21/67  bone]
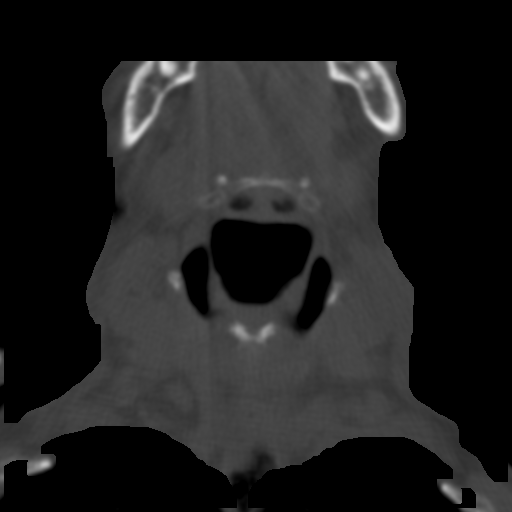
[im 26/67  soft-tissue]
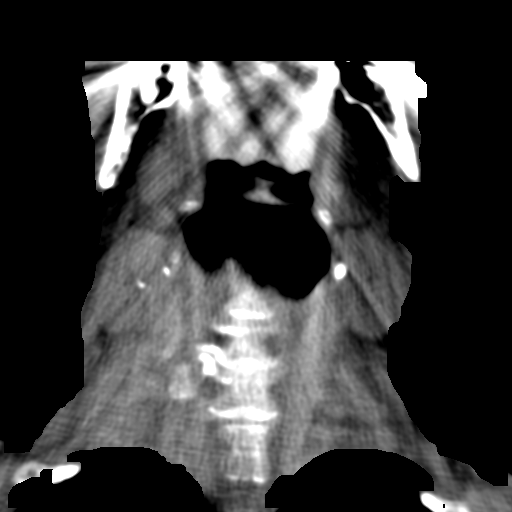
[im 26/67  bone]
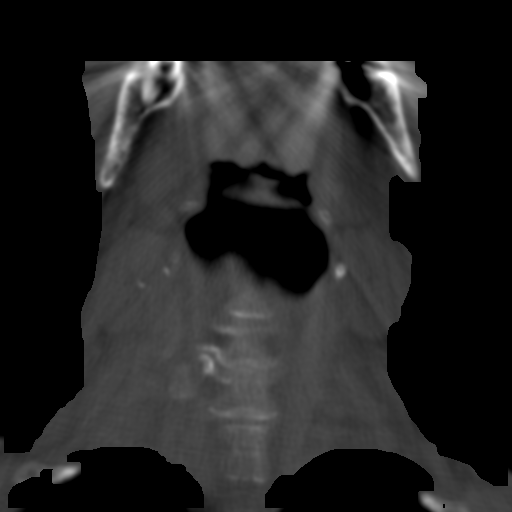
[im 31/67  bone]
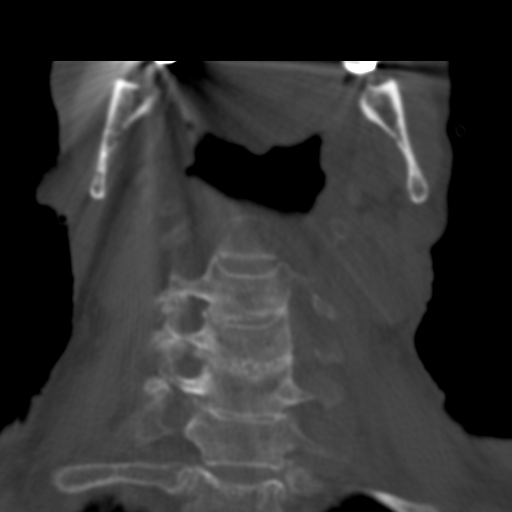
[im 36/67  bone]
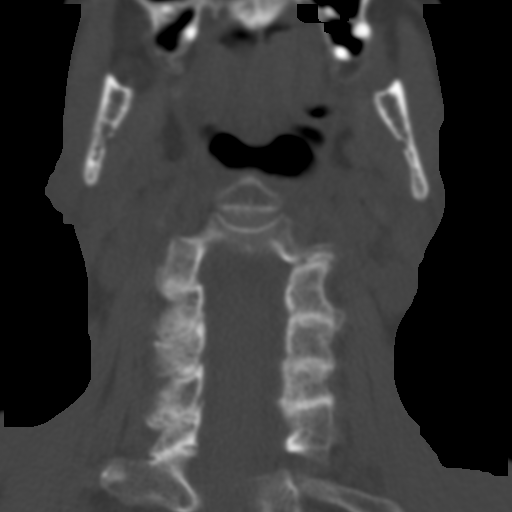
[im 41/67  bone]
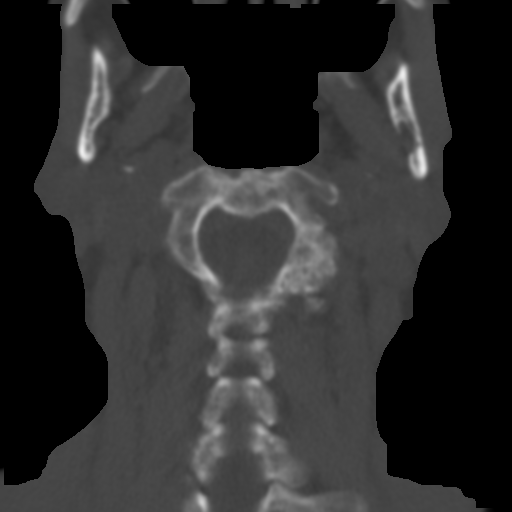
[im 46/67  soft-tissue]
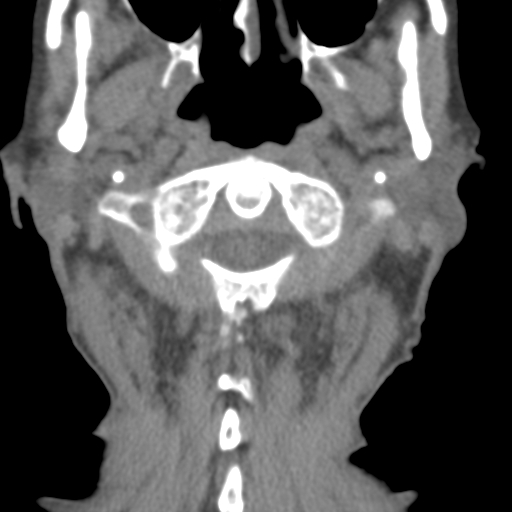
[im 46/67  bone]
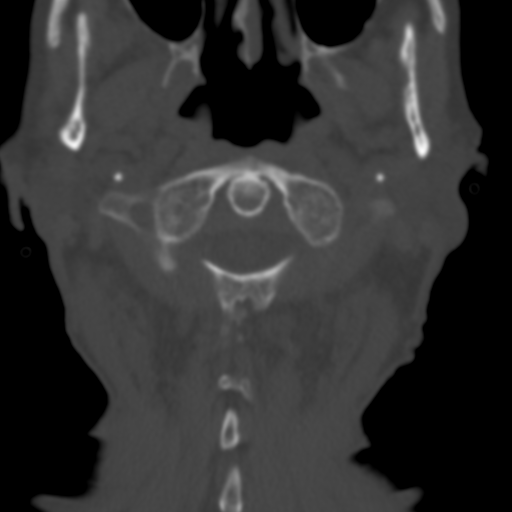
[im 51/67  bone]
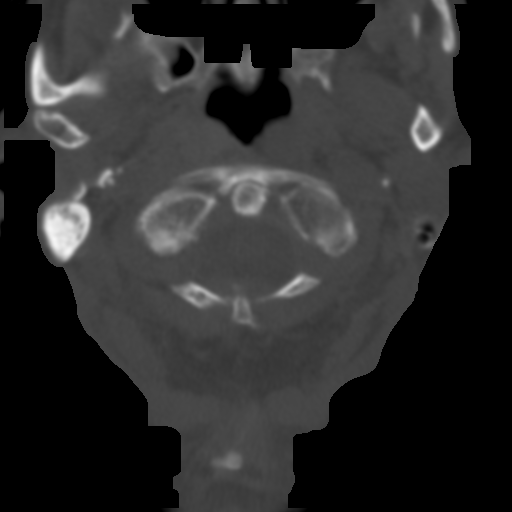
[im 56/67  bone]
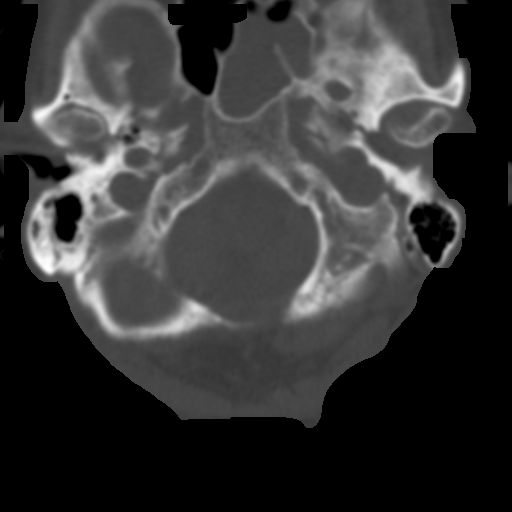
[im 61/67  bone]
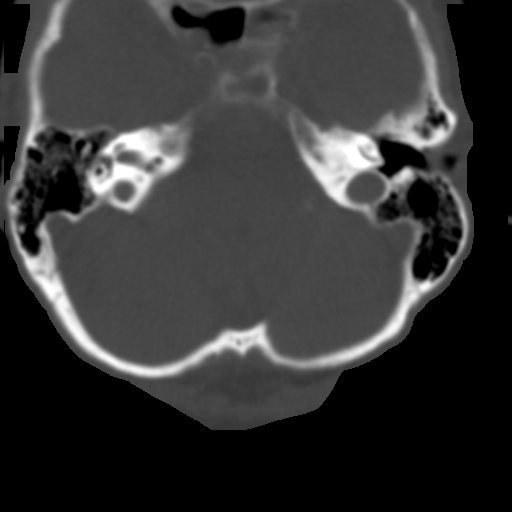

[Series 605: angled axials · coronal · 0.27mm/px · 3 of 86 slices shown]
[im 59/86  bone]
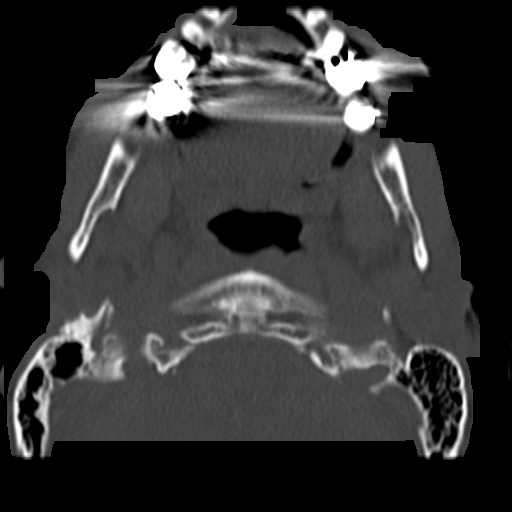
[im 65/86  bone]
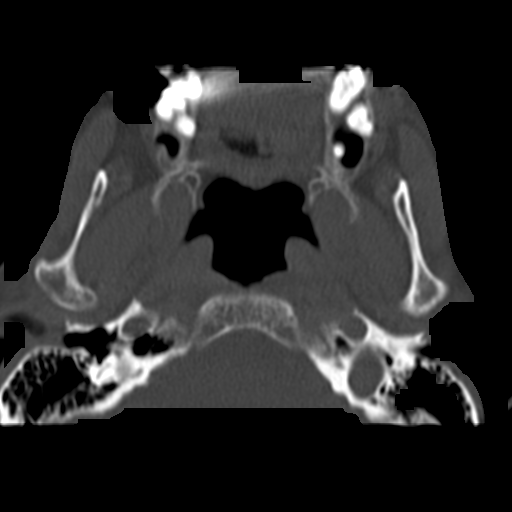
[im 72/86  bone]
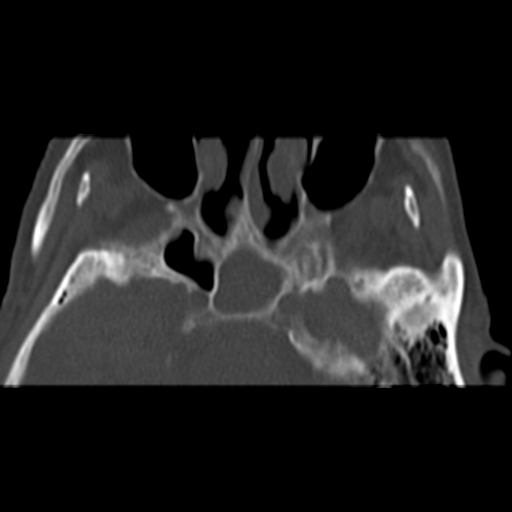

[15 of 20 positions shown; findings below may reference images not displayed]

FINDINGS: Alignment: 3 mm anterolisthesis of C4 on C5 secondary to facet
disease. 3 mm anterolisthesis of C6 on C7 secondary to facet
disease.

Skull base and vertebrae: Ununited transverse fracture at the base
of the odontoid process without callus formation. No aggressive
osseous lesion. No new fracture or dislocation. Incomplete posterior
arch of C1. This is likely developmental.

Soft tissues and spinal canal: No soft tissue abnormality. No spinal
stenosis.

Disc levels: Degenerative disc disease with mild disc height loss at
C4-5, C5-6 and C6-7. Bilateral facet arthropathy diffusely
throughout the cervical spine.

Upper chest: Lung apices are clear.

Other: No fluid collection or hematoma.
IMPRESSION: 1. Ununited, nondisplaced transverse fracture (type 2 dens fracture)
of the base of the odontoid process without significant callus
formation.
2. Cervical spine spondylosis as described above.

## 2018-10-06 DEATH — deceased
